# Patient Record
Sex: Male | Born: 1955 | Race: White | Hispanic: No | Marital: Married | Smoking: Never smoker
Health system: Southern US, Community
[De-identification: ages and names within clinical notes are randomized; demographics above are authoritative.]

## PROBLEM LIST (undated history)

## (undated) DIAGNOSIS — I10 Essential (primary) hypertension: Secondary | ICD-10-CM

## (undated) DIAGNOSIS — R569 Unspecified convulsions: Secondary | ICD-10-CM

## (undated) DIAGNOSIS — M1990 Unspecified osteoarthritis, unspecified site: Secondary | ICD-10-CM

## (undated) DIAGNOSIS — Z7289 Other problems related to lifestyle: Secondary | ICD-10-CM

## (undated) DIAGNOSIS — M109 Gout, unspecified: Secondary | ICD-10-CM

## (undated) DIAGNOSIS — R55 Syncope and collapse: Secondary | ICD-10-CM

## (undated) DIAGNOSIS — Z789 Other specified health status: Secondary | ICD-10-CM

## (undated) DIAGNOSIS — F1090 Alcohol use, unspecified, uncomplicated: Secondary | ICD-10-CM

## (undated) DIAGNOSIS — E785 Hyperlipidemia, unspecified: Secondary | ICD-10-CM

## (undated) DIAGNOSIS — J45909 Unspecified asthma, uncomplicated: Secondary | ICD-10-CM

## (undated) DIAGNOSIS — J349 Unspecified disorder of nose and nasal sinuses: Secondary | ICD-10-CM

## (undated) DIAGNOSIS — C73 Malignant neoplasm of thyroid gland: Secondary | ICD-10-CM

## (undated) HISTORY — PX: NASAL POLYP SURGERY: SHX186

## (undated) HISTORY — PX: POLYPECTOMY: SHX149

## (undated) HISTORY — DX: Unspecified disorder of nose and nasal sinuses: J34.9

## (undated) HISTORY — PX: THYROIDECTOMY: SHX17

## (undated) HISTORY — PX: COLONOSCOPY: SHX174

## (undated) HISTORY — DX: Gout, unspecified: M10.9

## (undated) HISTORY — PX: NASAL SEPTUM SURGERY: SHX37

## (undated) HISTORY — DX: Unspecified convulsions: R56.9

## (undated) HISTORY — DX: Unspecified asthma, uncomplicated: J45.909

---

## 2014-03-07 ENCOUNTER — Ambulatory Visit (INDEPENDENT_AMBULATORY_CARE_PROVIDER_SITE_OTHER): Payer: 59

## 2014-03-07 VITALS — BP 117/78 | HR 86 | Resp 18

## 2014-03-07 DIAGNOSIS — L600 Ingrowing nail: Secondary | ICD-10-CM

## 2014-03-07 DIAGNOSIS — M79609 Pain in unspecified limb: Secondary | ICD-10-CM

## 2014-03-07 DIAGNOSIS — L03039 Cellulitis of unspecified toe: Secondary | ICD-10-CM

## 2014-03-07 MED ORDER — CEPHALEXIN 500 MG PO CAPS: 500.0000 mg | ORAL_CAPSULE | Freq: Three times a day (TID) | ORAL | Status: DC

## 2014-03-07 NOTE — Progress Notes (Signed)
   Subjective:    Patient ID: Devon Gordon, male    DOB: Jul 30, 1956, 58 y.o.   MRN: [id number]  HPI my right big toenail has been going on for about 3 weeks now and sore and tender and some draining and throbs and hurts with pressure and have did some soaking to it    Review of Systems  All other systems reviewed and are negative.      Objective:   Physical Exam 58 year old white male well-developed well-nourished oriented x3 vital signs stable presents at this time with ingrowing right great toenail medial lateral borders some 40 years ago had the nail on the left hallux work on. At this time there is some edema and erythema medial lateral nail folds of the right great toe and there is Pitocin incurvation of the nail. No history of injury or trauma noted indicates but year ago he is to with Lamisil for fungus nails the nail improvement since that time is having a tendency for ingrowing nails borders. The artery objective findings as follows vascular status is intact pedal pulses palpable DP pulse two over four bilateral PT +2/4 bilateral capillary refill time 3 seconds all digits skin temperature warm turgor normal no edema rubor pallor or varicosities noted neurologically epicritic and proprioceptive sensations intact and symmetric bilateral is normal plantar response DTRs not listed neurologically skin color pigment normal hair growth present diminished distally nails criptotic incurvated hallux nail right with some edema and erythema and drainage tissue medial lateral nail folds being noted left hallux shows evidence of partial nail excision particular lateral border. Orthopedic biomechanical exam otherwise unremarkable noncontributory rectus foot type noted no other abnormalities normal findings noted       Assessment & Plan:  Assessment this time is ingrowing right hallux nail medial lateral borders with associated paronychia the nail folds plan at this time for recommendation is for nail  excision and phenol matricectomy medial lateral borders right great toe. Local anesthetic blocks Mr. to the right hallux total of 3 cc 50-50 mixture of 2% Xylocaine plain and 0.5% Marcaine plain Betadine prep performed the hallux nail borders medial lateral or excised phenol matricectomy followed by alcohol wash Silvadene cream and gauze dressing being applied to the right great toe. Patient how the procedure was given instructions for daily soaking prescription for cephalexin is dispensed recommended Tylenol as needed for pain recheck in 2-3 weeks for nail check and followup maintain accommodative shoes in the interim.  Harriet Masson DPM

## 2014-03-07 NOTE — Patient Instructions (Signed)
Betadine Soak Instructions  Purchase an 8 oz. bottle of BETADINE solution (Povidone)  THE DAY AFTER THE PROCEDURE  Place 1 tablespoon of betadine solution in a quart of warm tap water.  Submerge your foot or feet with outer bandage intact for the initial soak; this will allow the bandage to become moist and wet for easy lift off.  Once you remove your bandage, continue to soak in the solution for 20 minutes.  This soak should be done twice a day.  Next, remove your foot or feet from solution, blot dry the affected area and cover.  You may use a band aid large enough to cover the area or use gauze and tape.  Apply other medications to the area as directed by the doctor such as cortisporin otic solution (ear drops) or neosporin.  IF YOUR SKIN BECOMES IRRITATED WHILE USING THESE INSTRUCTIONS, IT IS OKAY TO SWITCH TO EPSOM SALTS AND WATER OR WHITE VINEGAR AND WATER.  Recommendations for pain: Take 2 Tylenol 3 times a day as needed for pain

## 2014-03-21 ENCOUNTER — Ambulatory Visit (INDEPENDENT_AMBULATORY_CARE_PROVIDER_SITE_OTHER): Payer: 59

## 2014-03-21 VITALS — BP 144/87 | HR 62 | Resp 18

## 2014-03-21 DIAGNOSIS — L600 Ingrowing nail: Secondary | ICD-10-CM

## 2014-03-21 DIAGNOSIS — Z09 Encounter for follow-up examination after completed treatment for conditions other than malignant neoplasm: Secondary | ICD-10-CM

## 2014-03-21 NOTE — Progress Notes (Signed)
   Subjective:    Patient ID: Adrick Kestler, male    DOB: May 16, 1956, 58 y.o.   MRN: [id number]  HPI my right big toenail is better and it is starting to scab over    Review of Systems no new systemic changes or findings are noted     Objective:   Physical Exam 58 year old white male consistent for postop visit following AP nail procedure right great toe medial lateral borders. Slight eschar tissue identified mild erythema is noted no ascending psoas with additional pain or discomfort patient is intact running. Neurovascular status is intact no signs of infection. Patient been soaking is instructed in maintaining Neosporin and Band-Aid dressings.       Assessment & Plan:  Assessment good postop progress keep Neosporin Band-Aid on her daily or drainage night discontinue any soaking the treatment or Band-Aids if it stops draining with the next 2-3 weeks if not heal within the next 3 or 4 weeks followup if needed  Harriet Masson DP

## 2014-03-21 NOTE — Patient Instructions (Signed)

## 2014-11-15 DIAGNOSIS — C73 Malignant neoplasm of thyroid gland: Secondary | ICD-10-CM

## 2014-11-15 HISTORY — DX: Malignant neoplasm of thyroid gland: C73

## 2015-08-01 DIAGNOSIS — J329 Chronic sinusitis, unspecified: Secondary | ICD-10-CM | POA: Insufficient documentation

## 2015-08-01 DIAGNOSIS — J4550 Severe persistent asthma, uncomplicated: Secondary | ICD-10-CM | POA: Insufficient documentation

## 2015-08-09 ENCOUNTER — Other Ambulatory Visit: Payer: Self-pay

## 2015-08-09 MED ORDER — OMALIZUMAB 150 MG ~~LOC~~ SOLR
300.0000 mg | SUBCUTANEOUS | Status: DC
  Administered 2015-09-18 – 2022-06-04 (×74): 300 mg via SUBCUTANEOUS

## 2015-09-18 ENCOUNTER — Ambulatory Visit (INDEPENDENT_AMBULATORY_CARE_PROVIDER_SITE_OTHER): Payer: 59

## 2015-09-18 DIAGNOSIS — J4540 Moderate persistent asthma, uncomplicated: Secondary | ICD-10-CM | POA: Diagnosis not present

## 2015-09-18 DIAGNOSIS — J4550 Severe persistent asthma, uncomplicated: Secondary | ICD-10-CM

## 2015-09-26 ENCOUNTER — Other Ambulatory Visit: Payer: Self-pay

## 2015-09-26 MED ORDER — MONTELUKAST SODIUM 10 MG PO TABS: 10.0000 mg | ORAL_TABLET | Freq: Every day | ORAL | Status: DC

## 2015-10-16 ENCOUNTER — Ambulatory Visit (INDEPENDENT_AMBULATORY_CARE_PROVIDER_SITE_OTHER): Payer: 59

## 2015-10-16 DIAGNOSIS — J4550 Severe persistent asthma, uncomplicated: Secondary | ICD-10-CM

## 2015-10-16 DIAGNOSIS — J4540 Moderate persistent asthma, uncomplicated: Secondary | ICD-10-CM

## 2015-11-13 ENCOUNTER — Ambulatory Visit (INDEPENDENT_AMBULATORY_CARE_PROVIDER_SITE_OTHER): Payer: 59

## 2015-11-13 DIAGNOSIS — J4550 Severe persistent asthma, uncomplicated: Secondary | ICD-10-CM | POA: Diagnosis not present

## 2015-11-15 ENCOUNTER — Ambulatory Visit: Payer: 59 | Admitting: *Deleted

## 2015-12-01 DIAGNOSIS — R569 Unspecified convulsions: Secondary | ICD-10-CM

## 2015-12-01 HISTORY — DX: Unspecified convulsions: R56.9

## 2015-12-11 ENCOUNTER — Ambulatory Visit (INDEPENDENT_AMBULATORY_CARE_PROVIDER_SITE_OTHER): Payer: 59 | Admitting: *Deleted

## 2015-12-11 DIAGNOSIS — J4540 Moderate persistent asthma, uncomplicated: Secondary | ICD-10-CM

## 2015-12-12 ENCOUNTER — Other Ambulatory Visit: Payer: Self-pay

## 2015-12-12 MED ORDER — FLUTICASONE PROPIONATE 50 MCG/ACT NA SUSP: 2.0000 | Freq: Every day | NASAL | Status: DC

## 2015-12-25 ENCOUNTER — Other Ambulatory Visit: Payer: Self-pay | Admitting: *Deleted

## 2015-12-25 MED ORDER — OMALIZUMAB 150 MG ~~LOC~~ SOLR: 300.0000 mg | SUBCUTANEOUS | Status: DC

## 2016-01-15 ENCOUNTER — Ambulatory Visit (INDEPENDENT_AMBULATORY_CARE_PROVIDER_SITE_OTHER): Payer: 59 | Admitting: *Deleted

## 2016-01-15 DIAGNOSIS — J4540 Moderate persistent asthma, uncomplicated: Secondary | ICD-10-CM | POA: Diagnosis not present

## 2016-02-13 ENCOUNTER — Emergency Department (HOSPITAL_COMMUNITY): Payer: No Typology Code available for payment source

## 2016-02-13 ENCOUNTER — Observation Stay (HOSPITAL_COMMUNITY): Payer: No Typology Code available for payment source

## 2016-02-13 ENCOUNTER — Encounter (HOSPITAL_COMMUNITY): Payer: Self-pay | Admitting: Emergency Medicine

## 2016-02-13 ENCOUNTER — Observation Stay (HOSPITAL_COMMUNITY)
Admission: EM | Admit: 2016-02-13 | Discharge: 2016-02-15 | Disposition: A | Discharge status: Home / Self Care | Payer: No Typology Code available for payment source | Attending: Oncology | Admitting: Oncology

## 2016-02-13 DIAGNOSIS — R569 Unspecified convulsions: Secondary | ICD-10-CM | POA: Insufficient documentation

## 2016-02-13 DIAGNOSIS — I951 Orthostatic hypotension: Secondary | ICD-10-CM | POA: Diagnosis not present

## 2016-02-13 DIAGNOSIS — M5136 Other intervertebral disc degeneration, lumbar region: Secondary | ICD-10-CM | POA: Insufficient documentation

## 2016-02-13 DIAGNOSIS — E785 Hyperlipidemia, unspecified: Secondary | ICD-10-CM | POA: Insufficient documentation

## 2016-02-13 DIAGNOSIS — G4089 Other seizures: Principal | ICD-10-CM | POA: Insufficient documentation

## 2016-02-13 DIAGNOSIS — Z789 Other specified health status: Secondary | ICD-10-CM | POA: Diagnosis present

## 2016-02-13 DIAGNOSIS — S32019A Unspecified fracture of first lumbar vertebra, initial encounter for closed fracture: Secondary | ICD-10-CM | POA: Diagnosis not present

## 2016-02-13 DIAGNOSIS — I10 Essential (primary) hypertension: Secondary | ICD-10-CM | POA: Diagnosis not present

## 2016-02-13 DIAGNOSIS — M109 Gout, unspecified: Secondary | ICD-10-CM | POA: Diagnosis not present

## 2016-02-13 DIAGNOSIS — J45909 Unspecified asthma, uncomplicated: Secondary | ICD-10-CM | POA: Diagnosis not present

## 2016-02-13 DIAGNOSIS — I444 Left anterior fascicular block: Secondary | ICD-10-CM | POA: Insufficient documentation

## 2016-02-13 DIAGNOSIS — E872 Acidosis: Secondary | ICD-10-CM | POA: Insufficient documentation

## 2016-02-13 DIAGNOSIS — Z79899 Other long term (current) drug therapy: Secondary | ICD-10-CM | POA: Insufficient documentation

## 2016-02-13 DIAGNOSIS — R55 Syncope and collapse: Secondary | ICD-10-CM | POA: Diagnosis present

## 2016-02-13 DIAGNOSIS — Z7289 Other problems related to lifestyle: Secondary | ICD-10-CM | POA: Diagnosis present

## 2016-02-13 DIAGNOSIS — M51369 Other intervertebral disc degeneration, lumbar region without mention of lumbar back pain or lower extremity pain: Secondary | ICD-10-CM | POA: Diagnosis present

## 2016-02-13 DIAGNOSIS — M1990 Unspecified osteoarthritis, unspecified site: Secondary | ICD-10-CM | POA: Diagnosis present

## 2016-02-13 DIAGNOSIS — I4510 Unspecified right bundle-branch block: Secondary | ICD-10-CM | POA: Diagnosis not present

## 2016-02-13 DIAGNOSIS — S32010A Wedge compression fracture of first lumbar vertebra, initial encounter for closed fracture: Secondary | ICD-10-CM | POA: Diagnosis present

## 2016-02-13 DIAGNOSIS — E890 Postprocedural hypothyroidism: Secondary | ICD-10-CM | POA: Diagnosis present

## 2016-02-13 DIAGNOSIS — Z85850 Personal history of malignant neoplasm of thyroid: Secondary | ICD-10-CM

## 2016-02-13 DIAGNOSIS — E8729 Other acidosis: Secondary | ICD-10-CM | POA: Diagnosis present

## 2016-02-13 DIAGNOSIS — R4020 Unspecified coma: Secondary | ICD-10-CM

## 2016-02-13 DIAGNOSIS — S32000A Wedge compression fracture of unspecified lumbar vertebra, initial encounter for closed fracture: Secondary | ICD-10-CM

## 2016-02-13 DIAGNOSIS — F1010 Alcohol abuse, uncomplicated: Secondary | ICD-10-CM | POA: Diagnosis present

## 2016-02-13 DIAGNOSIS — F1090 Alcohol use, unspecified, uncomplicated: Secondary | ICD-10-CM | POA: Diagnosis present

## 2016-02-13 HISTORY — DX: Other specified health status: Z78.9

## 2016-02-13 HISTORY — DX: Unspecified osteoarthritis, unspecified site: M19.90

## 2016-02-13 HISTORY — DX: Alcohol use, unspecified, uncomplicated: F10.90

## 2016-02-13 HISTORY — DX: Other problems related to lifestyle: Z72.89

## 2016-02-13 HISTORY — DX: Essential (primary) hypertension: I10

## 2016-02-13 HISTORY — DX: Malignant neoplasm of thyroid gland: C73

## 2016-02-13 HISTORY — DX: Hyperlipidemia, unspecified: E78.5

## 2016-02-13 HISTORY — DX: Syncope and collapse: R55

## 2016-02-13 LAB — CBC WITH DIFFERENTIAL/PLATELET
BASOS PCT: 1 %
Basophils Absolute: 0 10*3/uL (ref 0.0–0.1)
EOS ABS: 0.1 10*3/uL (ref 0.0–0.7)
EOS PCT: 1 %
HCT: 42.4 % (ref 39.0–52.0)
HEMOGLOBIN: 15 g/dL (ref 13.0–17.0)
Lymphocytes Relative: 26 %
Lymphs Abs: 1.5 10*3/uL (ref 0.7–4.0)
MCH: 31.4 pg (ref 26.0–34.0)
MCHC: 35.4 g/dL (ref 30.0–36.0)
MCV: 88.7 fL (ref 78.0–100.0)
MONOS PCT: 4 %
Monocytes Absolute: 0.2 10*3/uL (ref 0.1–1.0)
NEUTROS PCT: 68 %
Neutro Abs: 3.9 10*3/uL (ref 1.7–7.7)
PLATELETS: 162 10*3/uL (ref 150–400)
RBC: 4.78 MIL/uL (ref 4.22–5.81)
RDW: 12.6 % (ref 11.5–15.5)
WBC: 5.8 10*3/uL (ref 4.0–10.5)

## 2016-02-13 LAB — COMPREHENSIVE METABOLIC PANEL
ALK PHOS: 79 U/L (ref 38–126)
ALT: 27 U/L (ref 17–63)
AST: 26 U/L (ref 15–41)
Albumin: 4.4 g/dL (ref 3.5–5.0)
Anion gap: 14 (ref 5–15)
BILIRUBIN TOTAL: 0.5 mg/dL (ref 0.3–1.2)
BUN: 11 mg/dL (ref 6–20)
CALCIUM: 9.5 mg/dL (ref 8.9–10.3)
CO2: 21 mmol/L — ABNORMAL LOW (ref 22–32)
CREATININE: 1.08 mg/dL (ref 0.61–1.24)
Chloride: 106 mmol/L (ref 101–111)
GFR calc Af Amer: >60 mL/min (ref >60)
GLUCOSE: 132 mg/dL — AB (ref 65–99)
Potassium: 4 mmol/L (ref 3.5–5.1)
Sodium: 141 mmol/L (ref 135–145)
TOTAL PROTEIN: 7.2 g/dL (ref 6.5–8.1)

## 2016-02-13 LAB — RAPID URINE DRUG SCREEN, HOSP PERFORMED
Amphetamines: NOT DETECTED
BARBITURATES: NOT DETECTED
Benzodiazepines: NOT DETECTED
Cocaine: NOT DETECTED
Opiates: NOT DETECTED
TETRAHYDROCANNABINOL: NOT DETECTED

## 2016-02-13 LAB — I-STAT TROPONIN, ED: TROPONIN I, POC: 0 ng/mL (ref 0.00–0.08)

## 2016-02-13 LAB — URINALYSIS, ROUTINE W REFLEX MICROSCOPIC
BILIRUBIN URINE: NEGATIVE
GLUCOSE, UA: NEGATIVE mg/dL
HGB URINE DIPSTICK: NEGATIVE
KETONES UR: NEGATIVE mg/dL
Leukocytes, UA: NEGATIVE
NITRITE: NEGATIVE
PH: 5 (ref 5.0–8.0)
Protein, ur: NEGATIVE mg/dL
Specific Gravity, Urine: 1.016 (ref 1.005–1.030)

## 2016-02-13 LAB — PHOSPHORUS: Phosphorus: 2.5 mg/dL (ref 2.5–4.6)

## 2016-02-13 LAB — ETHANOL: Alcohol, Ethyl (B): <5 mg/dL (ref <5)

## 2016-02-13 LAB — MAGNESIUM: Magnesium: 2.2 mg/dL (ref 1.7–2.4)

## 2016-02-13 MED ORDER — SODIUM CHLORIDE 0.9% FLUSH
3.0000 mL | Freq: Two times a day (BID) | INTRAVENOUS | Status: DC
  Administered 2016-02-13 – 2016-02-15 (×4): 3 mL via INTRAVENOUS

## 2016-02-13 MED ORDER — ACETAMINOPHEN 325 MG PO TABS
650.0000 mg | ORAL_TABLET | Freq: Once | ORAL | Status: DC
Start: 2016-02-13 — End: 2016-02-13
  Filled 2016-02-13: qty 2

## 2016-02-13 MED ORDER — ACETAMINOPHEN 325 MG PO TABS
650.0000 mg | ORAL_TABLET | Freq: Four times a day (QID) | ORAL | Status: DC | PRN
  Administered 2016-02-13 – 2016-02-14 (×2): 650 mg via ORAL
  Filled 2016-02-13 (×2): qty 2

## 2016-02-13 MED ORDER — ONDANSETRON HCL 4 MG PO TABS: 4.0000 mg | ORAL_TABLET | Freq: Four times a day (QID) | ORAL | Status: DC | PRN

## 2016-02-13 MED ORDER — DIAZEPAM 5 MG PO TABS: 5.0000 mg | ORAL_TABLET | Freq: Once | ORAL | Status: DC

## 2016-02-13 MED ORDER — LORAZEPAM 1 MG PO TABS: 1.0000 mg | ORAL_TABLET | Freq: Four times a day (QID) | ORAL | Status: DC | PRN

## 2016-02-13 MED ORDER — THIAMINE HCL 100 MG/ML IJ SOLN: 100.0000 mg | Freq: Every day | INTRAMUSCULAR | Status: DC

## 2016-02-13 MED ORDER — ONDANSETRON HCL 4 MG/2ML IJ SOLN: 4.0000 mg | Freq: Four times a day (QID) | INTRAMUSCULAR | Status: DC | PRN

## 2016-02-13 MED ORDER — FOLIC ACID 1 MG PO TABS
1.0000 mg | ORAL_TABLET | Freq: Every day | ORAL | Status: DC
  Administered 2016-02-13 – 2016-02-15 (×3): 1 mg via ORAL
  Filled 2016-02-13 (×3): qty 1

## 2016-02-13 MED ORDER — ALLOPURINOL 100 MG PO TABS
100.0000 mg | ORAL_TABLET | Freq: Every day | ORAL | Status: DC
  Administered 2016-02-13 – 2016-02-15 (×3): 100 mg via ORAL
  Filled 2016-02-13 (×3): qty 1

## 2016-02-13 MED ORDER — LEVOTHYROXINE SODIUM 137 MCG PO TABS
137.0000 ug | ORAL_TABLET | Freq: Every day | ORAL | Status: DC
  Administered 2016-02-14 – 2016-02-15 (×2): 137 ug via ORAL
  Filled 2016-02-13 (×2): qty 1

## 2016-02-13 MED ORDER — ATORVASTATIN CALCIUM 40 MG PO TABS
40.0000 mg | ORAL_TABLET | Freq: Every day | ORAL | Status: DC
  Administered 2016-02-13 – 2016-02-14 (×2): 40 mg via ORAL
  Filled 2016-02-13 (×2): qty 1

## 2016-02-13 MED ORDER — DEXTROSE-NACL 5-0.9 % IV SOLN
INTRAVENOUS | Status: DC
  Administered 2016-02-13: 21:00:00 via INTRAVENOUS

## 2016-02-13 MED ORDER — ADULT MULTIVITAMIN W/MINERALS CH
1.0000 | ORAL_TABLET | Freq: Every day | ORAL | Status: DC
  Administered 2016-02-13 – 2016-02-15 (×3): 1 via ORAL
  Filled 2016-02-13 (×3): qty 1

## 2016-02-13 MED ORDER — DEXTROSE-NACL 5-0.9 % IV SOLN: INTRAVENOUS | Status: DC

## 2016-02-13 MED ORDER — ENOXAPARIN SODIUM 40 MG/0.4ML ~~LOC~~ SOLN
40.0000 mg | SUBCUTANEOUS | Status: DC
  Administered 2016-02-13 – 2016-02-14 (×2): 40 mg via SUBCUTANEOUS
  Filled 2016-02-13 (×2): qty 0.4

## 2016-02-13 MED ORDER — VITAMIN B-1 100 MG PO TABS
100.0000 mg | ORAL_TABLET | Freq: Every day | ORAL | Status: DC
  Administered 2016-02-13 – 2016-02-15 (×3): 100 mg via ORAL
  Filled 2016-02-13 (×3): qty 1

## 2016-02-13 MED ORDER — SENNOSIDES-DOCUSATE SODIUM 8.6-50 MG PO TABS: 1.0000 | ORAL_TABLET | Freq: Every evening | ORAL | Status: DC | PRN

## 2016-02-13 MED ORDER — MONTELUKAST SODIUM 10 MG PO TABS
10.0000 mg | ORAL_TABLET | Freq: Every day | ORAL | Status: DC
  Administered 2016-02-13 – 2016-02-14 (×2): 10 mg via ORAL
  Filled 2016-02-13 (×2): qty 1

## 2016-02-13 MED ORDER — ACETAMINOPHEN 650 MG RE SUPP: 650.0000 mg | Freq: Four times a day (QID) | RECTAL | Status: DC | PRN

## 2016-02-13 MED ORDER — LORAZEPAM 2 MG/ML IJ SOLN: 1.0000 mg | Freq: Four times a day (QID) | INTRAMUSCULAR | Status: DC | PRN

## 2016-02-13 NOTE — Progress Notes (Signed)
EEG Completed; Results Pending  
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
## 2016-02-13 NOTE — ED Notes (Signed)
Admitting MD at bedside.
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
## 2016-02-13 NOTE — Progress Notes (Signed)
At 1650 pt oriented to room.  Call bell at reach.  Spouse at bed side.  Instructed to call for assistance.  Verbalized understanding. Karie Kirks, Therapist, sports.
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
## 2016-02-13 NOTE — Progress Notes (Signed)
Notified Dr. Hermenia Bers of pt's arrival to floor.  No new orders given.  Karie Kirks, Therapist, sports.

## 2016-02-14 ENCOUNTER — Observation Stay (HOSPITAL_COMMUNITY): Payer: No Typology Code available for payment source

## 2016-02-14 DIAGNOSIS — M109 Gout, unspecified: Secondary | ICD-10-CM

## 2016-02-14 DIAGNOSIS — I10 Essential (primary) hypertension: Secondary | ICD-10-CM

## 2016-02-14 DIAGNOSIS — Z9089 Acquired absence of other organs: Secondary | ICD-10-CM

## 2016-02-14 DIAGNOSIS — S32010A Wedge compression fracture of first lumbar vertebra, initial encounter for closed fracture: Secondary | ICD-10-CM

## 2016-02-14 DIAGNOSIS — X58XXXA Exposure to other specified factors, initial encounter: Secondary | ICD-10-CM

## 2016-02-14 DIAGNOSIS — Z85850 Personal history of malignant neoplasm of thyroid: Secondary | ICD-10-CM

## 2016-02-14 DIAGNOSIS — R569 Unspecified convulsions: Secondary | ICD-10-CM | POA: Diagnosis not present

## 2016-02-14 DIAGNOSIS — E785 Hyperlipidemia, unspecified: Secondary | ICD-10-CM

## 2016-02-14 DIAGNOSIS — G40A09 Absence epileptic syndrome, not intractable, without status epilepticus: Secondary | ICD-10-CM | POA: Diagnosis not present

## 2016-02-14 DIAGNOSIS — G4089 Other seizures: Secondary | ICD-10-CM | POA: Diagnosis not present

## 2016-02-14 LAB — BASIC METABOLIC PANEL
Anion gap: 9 (ref 5–15)
BUN: 12 mg/dL (ref 6–20)
CALCIUM: 8.3 mg/dL — AB (ref 8.9–10.3)
CO2: 24 mmol/L (ref 22–32)
Chloride: 109 mmol/L (ref 101–111)
Creatinine, Ser: 1.04 mg/dL (ref 0.61–1.24)
GFR calc Af Amer: >60 mL/min (ref >60)
GLUCOSE: 116 mg/dL — AB (ref 65–99)
POTASSIUM: 3.9 mmol/L (ref 3.5–5.1)
SODIUM: 142 mmol/L (ref 135–145)

## 2016-02-14 LAB — HIV ANTIBODY (ROUTINE TESTING W REFLEX): HIV SCREEN 4TH GENERATION: NONREACTIVE

## 2016-02-14 LAB — TROPONIN I

## 2016-02-14 MED ORDER — ACETAMINOPHEN 500 MG PO TABS
1000.0000 mg | ORAL_TABLET | Freq: Three times a day (TID) | ORAL | Status: DC
Start: 2016-02-14 — End: 2016-02-15
  Administered 2016-02-14 – 2016-02-15 (×4): 1000 mg via ORAL
  Filled 2016-02-14 (×4): qty 2

## 2016-02-14 MED ORDER — GADOBENATE DIMEGLUMINE 529 MG/ML IV SOLN
19.0000 mL | Freq: Once | INTRAVENOUS | Status: AC | PRN
  Administered 2016-02-14: 19 mL via INTRAVENOUS

## 2016-02-14 MED ORDER — DIVALPROEX SODIUM 250 MG PO DR TAB
500.0000 mg | DELAYED_RELEASE_TABLET | Freq: Two times a day (BID) | ORAL | Status: DC
  Administered 2016-02-14 – 2016-02-15 (×2): 500 mg via ORAL
  Filled 2016-02-14 (×2): qty 2

## 2016-02-14 MED ORDER — VALPROATE SODIUM 500 MG/5ML IV SOLN
1000.0000 mg | Freq: Once | INTRAVENOUS | Status: AC
  Administered 2016-02-14: 1000 mg via INTRAVENOUS
  Filled 2016-02-14: qty 10
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
## 2016-02-15 DIAGNOSIS — G4089 Other seizures: Secondary | ICD-10-CM | POA: Diagnosis not present

## 2016-02-15 DIAGNOSIS — R569 Unspecified convulsions: Secondary | ICD-10-CM | POA: Diagnosis not present

## 2016-02-15 LAB — HEPATITIS C ANTIBODY (REFLEX): HCV AB: 0.1 {s_co_ratio} (ref 0.0–0.9)

## 2016-02-15 LAB — VALPROIC ACID LEVEL: Valproic Acid Lvl: 42 ug/mL — ABNORMAL LOW (ref 50.0–100.0)

## 2016-02-15 LAB — HCV COMMENT:

## 2016-02-15 MED ORDER — LIDOCAINE 5 % EX PTCH: 1.0000 | MEDICATED_PATCH | CUTANEOUS | Status: DC

## 2016-02-15 MED ORDER — ACETAMINOPHEN 500 MG PO TABS
1000.0000 mg | ORAL_TABLET | Freq: Three times a day (TID) | ORAL | Status: DC | PRN
Start: 2016-02-15 — End: 2016-06-22

## 2016-02-15 MED ORDER — DIVALPROEX SODIUM 250 MG PO DR TAB: 750.0000 mg | DELAYED_RELEASE_TABLET | Freq: Two times a day (BID) | ORAL | Status: AC

## 2016-02-15 MED ORDER — LIDOCAINE 5 % EX PTCH
1.0000 | MEDICATED_PATCH | CUTANEOUS | Status: DC
  Administered 2016-02-15: 1 via TRANSDERMAL
  Filled 2016-02-15: qty 1

## 2016-02-15 MED ORDER — DIVALPROEX SODIUM 250 MG PO DR TAB: 750.0000 mg | DELAYED_RELEASE_TABLET | Freq: Two times a day (BID) | ORAL | Status: DC
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
## 2016-02-15 NOTE — Progress Notes (Signed)
At 1639 all d/c instructions explained and given to pt.  Verbalized understanding.   D/c off floor via w/c to awaiting transport.  Karie Kirks, Therapist, sports.

## 2016-02-15 NOTE — Discharge Instructions (Addendum)
Continue to take your seizure medications: Depakote 1 tablet twice a day.  Follow up with Dr. Delice Lesch, neurologist. No driving or operating heavy machinery until cleared by outpatient neurology. Repeat EEG in 2 weeks with recheck of Depakote level, liver function tests, and platelets.  Do not take your blood pressure medication until cleared by your primary care physician.   Seizure, Adult A seizure means there is unusual activity in the brain. A seizure can cause changes in attention or behavior.  HOME CARE   If you are given medicines, take them exactly as told by your doctor.  Keep all doctor visits as told.  Do not swim or drive until your doctor says it is okay.  Teach others what to do if you have a seizure. They should:  Lay you on the ground.  Put a cushion under your head.  Loosen any tight clothing around your neck.  Turn you on your side.  Stay with you until you get better. GET HELP RIGHT AWAY IF:   The seizure lasts longer than 2 to 5 minutes.  The seizure is very bad.  The person does not wake up after the seizure.  The person's attention or behavior changes. Drive the person to the emergency room or call your local emergency services (911 in [place] MAKE SURE YOU:   Understand these instructions.  Will watch your condition.  Will get help right away if you are not doing well or get worse.   This information is not intended to replace advice given to you by your health care provider. Make sure you discuss any questions you have with your health care provider.   Document Released: 05/04/2008 Document Revised: 02/08/2012 Document Reviewed: 06/28/2013 Elsevier Interactive Patient Education [employer].

## 2016-02-19 ENCOUNTER — Ambulatory Visit (INDEPENDENT_AMBULATORY_CARE_PROVIDER_SITE_OTHER): Payer: 59 | Admitting: *Deleted

## 2016-02-19 DIAGNOSIS — J4540 Moderate persistent asthma, uncomplicated: Secondary | ICD-10-CM

## 2016-03-19 ENCOUNTER — Ambulatory Visit (INDEPENDENT_AMBULATORY_CARE_PROVIDER_SITE_OTHER): Payer: 59 | Admitting: *Deleted

## 2016-03-19 DIAGNOSIS — J4540 Moderate persistent asthma, uncomplicated: Secondary | ICD-10-CM | POA: Diagnosis not present

## 2016-04-23 ENCOUNTER — Ambulatory Visit (INDEPENDENT_AMBULATORY_CARE_PROVIDER_SITE_OTHER): Payer: 59 | Admitting: *Deleted

## 2016-04-23 DIAGNOSIS — J4540 Moderate persistent asthma, uncomplicated: Secondary | ICD-10-CM

## 2016-05-21 ENCOUNTER — Ambulatory Visit (INDEPENDENT_AMBULATORY_CARE_PROVIDER_SITE_OTHER): Payer: 59 | Admitting: *Deleted

## 2016-05-21 DIAGNOSIS — J4540 Moderate persistent asthma, uncomplicated: Secondary | ICD-10-CM | POA: Diagnosis not present
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
## 2016-06-18 ENCOUNTER — Ambulatory Visit (INDEPENDENT_AMBULATORY_CARE_PROVIDER_SITE_OTHER): Payer: 59 | Admitting: *Deleted

## 2016-06-18 DIAGNOSIS — J4540 Moderate persistent asthma, uncomplicated: Secondary | ICD-10-CM | POA: Diagnosis not present
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
## 2016-06-22 ENCOUNTER — Ambulatory Visit (INDEPENDENT_AMBULATORY_CARE_PROVIDER_SITE_OTHER): Payer: 59 | Admitting: Allergy and Immunology

## 2016-06-22 ENCOUNTER — Encounter: Payer: Self-pay | Admitting: Allergy and Immunology

## 2016-06-22 VITALS — BP 120/60 | HR 64 | Resp 20 | Ht 66.8 in | Wt 200.4 lb

## 2016-06-22 DIAGNOSIS — J45909 Unspecified asthma, uncomplicated: Principal | ICD-10-CM

## 2016-06-22 DIAGNOSIS — J339 Nasal polyp, unspecified: Secondary | ICD-10-CM | POA: Diagnosis not present

## 2016-06-22 DIAGNOSIS — J45998 Other asthma: Secondary | ICD-10-CM

## 2016-06-22 DIAGNOSIS — Z886 Allergy status to analgesic agent status: Principal | ICD-10-CM

## 2016-06-22 MED ORDER — MONTELUKAST SODIUM 10 MG PO TABS
10.0000 mg | ORAL_TABLET | Freq: Every day | ORAL | 5 refills | Status: AC
Start: 2016-06-22 — End: ?
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
## 2016-06-22 NOTE — Patient Instructions (Signed)
  1. Continue Xolair and EpiPen  2. Continue Flonase one-2 sprays each nostril once a day  3. Continue montelukast 10 mg one tablet once a day  4. Continue Proventil HFA if needed  5. Obtain fall flu vaccine  6. Return to clinic in 1 year or earlier if problem

## 2016-07-16 ENCOUNTER — Ambulatory Visit (INDEPENDENT_AMBULATORY_CARE_PROVIDER_SITE_OTHER): Payer: 59 | Admitting: *Deleted

## 2016-07-16 DIAGNOSIS — J4540 Moderate persistent asthma, uncomplicated: Secondary | ICD-10-CM | POA: Diagnosis not present

## 2016-08-13 ENCOUNTER — Ambulatory Visit (INDEPENDENT_AMBULATORY_CARE_PROVIDER_SITE_OTHER): Payer: 59 | Admitting: *Deleted

## 2016-08-13 DIAGNOSIS — J4540 Moderate persistent asthma, uncomplicated: Secondary | ICD-10-CM | POA: Diagnosis not present

## 2016-09-16 ENCOUNTER — Ambulatory Visit (INDEPENDENT_AMBULATORY_CARE_PROVIDER_SITE_OTHER): Payer: 59

## 2016-09-16 DIAGNOSIS — J4540 Moderate persistent asthma, uncomplicated: Secondary | ICD-10-CM

## 2016-09-21 ENCOUNTER — Other Ambulatory Visit: Payer: Self-pay

## 2016-09-21 MED ORDER — FLUTICASONE PROPIONATE 50 MCG/ACT NA SUSP
NASAL | 10 refills | Status: AC
Start: 2016-09-21 — End: ?

## 2016-10-14 ENCOUNTER — Ambulatory Visit (INDEPENDENT_AMBULATORY_CARE_PROVIDER_SITE_OTHER): Payer: 59

## 2016-10-14 DIAGNOSIS — J4540 Moderate persistent asthma, uncomplicated: Secondary | ICD-10-CM

## 2016-11-11 ENCOUNTER — Ambulatory Visit (INDEPENDENT_AMBULATORY_CARE_PROVIDER_SITE_OTHER): Payer: 59 | Admitting: *Deleted

## 2016-11-11 DIAGNOSIS — J4540 Moderate persistent asthma, uncomplicated: Secondary | ICD-10-CM | POA: Diagnosis not present

## 2016-12-03 ENCOUNTER — Other Ambulatory Visit: Payer: Self-pay | Admitting: Allergy and Immunology

## 2016-12-09 ENCOUNTER — Ambulatory Visit (INDEPENDENT_AMBULATORY_CARE_PROVIDER_SITE_OTHER): Payer: 59

## 2016-12-09 DIAGNOSIS — J4540 Moderate persistent asthma, uncomplicated: Secondary | ICD-10-CM

## 2017-01-06 ENCOUNTER — Ambulatory Visit (INDEPENDENT_AMBULATORY_CARE_PROVIDER_SITE_OTHER): Payer: 59 | Admitting: *Deleted

## 2017-01-06 DIAGNOSIS — J4540 Moderate persistent asthma, uncomplicated: Secondary | ICD-10-CM

## 2017-02-03 ENCOUNTER — Ambulatory Visit (INDEPENDENT_AMBULATORY_CARE_PROVIDER_SITE_OTHER): Payer: 59 | Admitting: *Deleted

## 2017-02-03 DIAGNOSIS — J4540 Moderate persistent asthma, uncomplicated: Secondary | ICD-10-CM | POA: Diagnosis not present

## 2017-03-03 ENCOUNTER — Ambulatory Visit (INDEPENDENT_AMBULATORY_CARE_PROVIDER_SITE_OTHER): Payer: 59

## 2017-03-03 DIAGNOSIS — J4540 Moderate persistent asthma, uncomplicated: Secondary | ICD-10-CM | POA: Diagnosis not present

## 2017-03-31 ENCOUNTER — Ambulatory Visit (INDEPENDENT_AMBULATORY_CARE_PROVIDER_SITE_OTHER): Payer: 59 | Admitting: *Deleted

## 2017-03-31 DIAGNOSIS — J4540 Moderate persistent asthma, uncomplicated: Secondary | ICD-10-CM | POA: Diagnosis not present

## 2017-04-28 ENCOUNTER — Ambulatory Visit (INDEPENDENT_AMBULATORY_CARE_PROVIDER_SITE_OTHER): Payer: 59 | Admitting: *Deleted

## 2017-04-28 DIAGNOSIS — J4540 Moderate persistent asthma, uncomplicated: Secondary | ICD-10-CM

## 2017-05-26 ENCOUNTER — Ambulatory Visit (INDEPENDENT_AMBULATORY_CARE_PROVIDER_SITE_OTHER): Payer: 59 | Admitting: *Deleted

## 2017-05-26 DIAGNOSIS — J4540 Moderate persistent asthma, uncomplicated: Secondary | ICD-10-CM | POA: Diagnosis not present

## 2017-06-23 ENCOUNTER — Ambulatory Visit (INDEPENDENT_AMBULATORY_CARE_PROVIDER_SITE_OTHER): Payer: 59

## 2017-06-23 DIAGNOSIS — J4540 Moderate persistent asthma, uncomplicated: Secondary | ICD-10-CM | POA: Diagnosis not present

## 2017-07-21 ENCOUNTER — Ambulatory Visit (INDEPENDENT_AMBULATORY_CARE_PROVIDER_SITE_OTHER): Payer: 59 | Admitting: *Deleted

## 2017-07-21 DIAGNOSIS — J4540 Moderate persistent asthma, uncomplicated: Secondary | ICD-10-CM

## 2017-08-18 ENCOUNTER — Ambulatory Visit (INDEPENDENT_AMBULATORY_CARE_PROVIDER_SITE_OTHER): Payer: 59 | Admitting: *Deleted

## 2017-08-18 DIAGNOSIS — J4540 Moderate persistent asthma, uncomplicated: Secondary | ICD-10-CM | POA: Diagnosis not present

## 2017-09-15 ENCOUNTER — Ambulatory Visit (INDEPENDENT_AMBULATORY_CARE_PROVIDER_SITE_OTHER): Payer: 59 | Admitting: *Deleted

## 2017-09-15 DIAGNOSIS — J4540 Moderate persistent asthma, uncomplicated: Secondary | ICD-10-CM

## 2017-10-13 ENCOUNTER — Ambulatory Visit (INDEPENDENT_AMBULATORY_CARE_PROVIDER_SITE_OTHER): Payer: 59

## 2017-10-13 DIAGNOSIS — J4540 Moderate persistent asthma, uncomplicated: Secondary | ICD-10-CM | POA: Diagnosis not present

## 2017-11-02 ENCOUNTER — Other Ambulatory Visit: Payer: Self-pay | Admitting: Allergy and Immunology

## 2017-11-10 ENCOUNTER — Ambulatory Visit (INDEPENDENT_AMBULATORY_CARE_PROVIDER_SITE_OTHER): Payer: 59 | Admitting: *Deleted

## 2017-11-10 DIAGNOSIS — J4540 Moderate persistent asthma, uncomplicated: Secondary | ICD-10-CM

## 2017-12-08 ENCOUNTER — Ambulatory Visit (INDEPENDENT_AMBULATORY_CARE_PROVIDER_SITE_OTHER): Payer: 59

## 2017-12-08 DIAGNOSIS — J4540 Moderate persistent asthma, uncomplicated: Secondary | ICD-10-CM | POA: Diagnosis not present

## 2018-01-05 ENCOUNTER — Ambulatory Visit (INDEPENDENT_AMBULATORY_CARE_PROVIDER_SITE_OTHER): Payer: 59 | Admitting: *Deleted

## 2018-01-05 DIAGNOSIS — J4540 Moderate persistent asthma, uncomplicated: Secondary | ICD-10-CM

## 2018-01-24 ENCOUNTER — Encounter: Payer: Self-pay | Admitting: Allergy and Immunology

## 2018-01-24 ENCOUNTER — Ambulatory Visit (INDEPENDENT_AMBULATORY_CARE_PROVIDER_SITE_OTHER): Payer: 59 | Admitting: Allergy and Immunology

## 2018-01-24 VITALS — BP 148/80 | HR 64 | Resp 16

## 2018-01-24 DIAGNOSIS — Z886 Allergy status to analgesic agent status: Secondary | ICD-10-CM

## 2018-01-24 DIAGNOSIS — J339 Nasal polyp, unspecified: Secondary | ICD-10-CM | POA: Diagnosis not present

## 2018-01-24 DIAGNOSIS — J45909 Unspecified asthma, uncomplicated: Secondary | ICD-10-CM

## 2018-01-24 NOTE — Patient Instructions (Addendum)
  1. Continue Xolair and EpiPen  2. Continue Flonase one-2 sprays each nostril once a day  3. Continue montelukast 10 mg one tablet once a day  4. Continue Proventil HFA if needed  5. PREDNISONE 10mg  tablet one time per day for 10 days. Further treatment?  6. Return to clinic in 1 year or earlier if problem
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
## 2018-01-25 ENCOUNTER — Encounter: Payer: Self-pay | Admitting: Allergy and Immunology

## 2018-02-02 ENCOUNTER — Ambulatory Visit (INDEPENDENT_AMBULATORY_CARE_PROVIDER_SITE_OTHER): Payer: 59 | Admitting: *Deleted

## 2018-02-02 DIAGNOSIS — J4540 Moderate persistent asthma, uncomplicated: Secondary | ICD-10-CM | POA: Diagnosis not present

## 2018-03-02 ENCOUNTER — Ambulatory Visit (INDEPENDENT_AMBULATORY_CARE_PROVIDER_SITE_OTHER): Payer: 59 | Admitting: *Deleted

## 2018-03-02 DIAGNOSIS — J4540 Moderate persistent asthma, uncomplicated: Secondary | ICD-10-CM

## 2018-03-30 ENCOUNTER — Ambulatory Visit: Payer: Self-pay

## 2018-03-31 ENCOUNTER — Ambulatory Visit (INDEPENDENT_AMBULATORY_CARE_PROVIDER_SITE_OTHER): Payer: 59 | Admitting: *Deleted

## 2018-03-31 DIAGNOSIS — J4540 Moderate persistent asthma, uncomplicated: Secondary | ICD-10-CM | POA: Diagnosis not present

## 2018-04-28 ENCOUNTER — Ambulatory Visit (INDEPENDENT_AMBULATORY_CARE_PROVIDER_SITE_OTHER): Payer: 59 | Admitting: *Deleted

## 2018-04-28 DIAGNOSIS — J4540 Moderate persistent asthma, uncomplicated: Secondary | ICD-10-CM | POA: Diagnosis not present

## 2018-05-26 ENCOUNTER — Ambulatory Visit: Payer: Self-pay

## 2018-05-30 ENCOUNTER — Ambulatory Visit (INDEPENDENT_AMBULATORY_CARE_PROVIDER_SITE_OTHER): Payer: 59 | Admitting: *Deleted

## 2018-05-30 DIAGNOSIS — J4540 Moderate persistent asthma, uncomplicated: Secondary | ICD-10-CM | POA: Diagnosis not present

## 2018-06-27 ENCOUNTER — Ambulatory Visit (INDEPENDENT_AMBULATORY_CARE_PROVIDER_SITE_OTHER): Payer: 59 | Admitting: *Deleted

## 2018-06-27 DIAGNOSIS — J4540 Moderate persistent asthma, uncomplicated: Secondary | ICD-10-CM | POA: Diagnosis not present

## 2018-07-25 ENCOUNTER — Ambulatory Visit (INDEPENDENT_AMBULATORY_CARE_PROVIDER_SITE_OTHER): Payer: 59 | Admitting: *Deleted

## 2018-07-25 DIAGNOSIS — J4540 Moderate persistent asthma, uncomplicated: Secondary | ICD-10-CM

## 2018-08-22 ENCOUNTER — Ambulatory Visit (INDEPENDENT_AMBULATORY_CARE_PROVIDER_SITE_OTHER): Payer: 59 | Admitting: *Deleted

## 2018-08-22 DIAGNOSIS — J4540 Moderate persistent asthma, uncomplicated: Secondary | ICD-10-CM | POA: Diagnosis not present

## 2018-09-19 ENCOUNTER — Ambulatory Visit (INDEPENDENT_AMBULATORY_CARE_PROVIDER_SITE_OTHER): Payer: 59 | Admitting: *Deleted

## 2018-09-19 DIAGNOSIS — J4540 Moderate persistent asthma, uncomplicated: Secondary | ICD-10-CM | POA: Diagnosis not present

## 2018-10-02 ENCOUNTER — Other Ambulatory Visit: Payer: Self-pay | Admitting: Allergy and Immunology

## 2018-10-17 ENCOUNTER — Ambulatory Visit (INDEPENDENT_AMBULATORY_CARE_PROVIDER_SITE_OTHER): Payer: 59 | Admitting: *Deleted

## 2018-10-17 DIAGNOSIS — J4540 Moderate persistent asthma, uncomplicated: Secondary | ICD-10-CM | POA: Diagnosis not present

## 2018-11-14 ENCOUNTER — Ambulatory Visit (INDEPENDENT_AMBULATORY_CARE_PROVIDER_SITE_OTHER): Payer: 59 | Admitting: *Deleted

## 2018-11-14 DIAGNOSIS — J4540 Moderate persistent asthma, uncomplicated: Secondary | ICD-10-CM | POA: Diagnosis not present

## 2018-11-28 ENCOUNTER — Encounter: Payer: Self-pay | Admitting: Gastroenterology

## 2018-12-12 ENCOUNTER — Ambulatory Visit (INDEPENDENT_AMBULATORY_CARE_PROVIDER_SITE_OTHER): Payer: 59 | Admitting: *Deleted

## 2018-12-12 DIAGNOSIS — J4540 Moderate persistent asthma, uncomplicated: Secondary | ICD-10-CM | POA: Diagnosis not present

## 2018-12-13 ENCOUNTER — Encounter: Payer: Self-pay | Admitting: Gastroenterology

## 2018-12-13 ENCOUNTER — Ambulatory Visit (AMBULATORY_SURGERY_CENTER): Payer: Self-pay | Admitting: *Deleted

## 2018-12-13 VITALS — Ht 68.0 in | Wt 202.0 lb

## 2018-12-13 DIAGNOSIS — Z86010 Personal history of colonic polyps: Secondary | ICD-10-CM

## 2018-12-13 MED ORDER — NA SULFATE-K SULFATE-MG SULF 17.5-3.13-1.6 GM/177ML PO SOLN: ORAL | 0 refills | Status: DC

## 2018-12-13 NOTE — Progress Notes (Signed)
Patient denies any allergies to eggs or soy. Patient denies any problems with anesthesia/sedation. Patient denies any oxygen use at home. Patient denies taking any diet/weight loss medications or blood thinners. EMMI education offered, pt declined. Suprep coupon printed and given to pt.

## 2018-12-26 ENCOUNTER — Encounter: Payer: Self-pay | Admitting: Gastroenterology

## 2019-01-09 ENCOUNTER — Ambulatory Visit (INDEPENDENT_AMBULATORY_CARE_PROVIDER_SITE_OTHER): Payer: 59 | Admitting: *Deleted

## 2019-01-09 DIAGNOSIS — J4540 Moderate persistent asthma, uncomplicated: Secondary | ICD-10-CM

## 2019-01-18 ENCOUNTER — Encounter: Payer: Self-pay | Admitting: Gastroenterology

## 2019-01-27 ENCOUNTER — Encounter: Payer: Self-pay | Admitting: Gastroenterology

## 2019-01-27 ENCOUNTER — Ambulatory Visit (AMBULATORY_SURGERY_CENTER): Payer: 59 | Admitting: Gastroenterology

## 2019-01-27 VITALS — BP 116/73 | HR 54 | Temp 97.5°F | Resp 9 | Ht 68.0 in | Wt 202.0 lb

## 2019-01-27 DIAGNOSIS — D122 Benign neoplasm of ascending colon: Secondary | ICD-10-CM | POA: Diagnosis not present

## 2019-01-27 DIAGNOSIS — Z86010 Personal history of colonic polyps: Secondary | ICD-10-CM | POA: Diagnosis present

## 2019-01-27 DIAGNOSIS — D128 Benign neoplasm of rectum: Secondary | ICD-10-CM | POA: Diagnosis not present

## 2019-01-27 MED ORDER — SODIUM CHLORIDE 0.9 % IV SOLN: 500.0000 mL | Freq: Once | INTRAVENOUS | Status: DC

## 2019-01-27 NOTE — Patient Instructions (Signed)
YOU HAD AN ENDOSCOPIC PROCEDURE TODAY AT [facility]:   Refer to the procedure report that was given to you for any specific questions about what was found during the examination.  If the procedure report does not answer your questions, please call your gastroenterologist to clarify.  If you requested that your care partner not be given the details of your procedure findings, then the procedure report has been included in a sealed envelope for you to review at your convenience later.  YOU SHOULD EXPECT: Some feelings of bloating in the abdomen. Passage of more gas than usual.  Walking can help get rid of the air that was put into your GI tract during the procedure and reduce the bloating. If you had a lower endoscopy (such as a colonoscopy or flexible sigmoidoscopy) you may notice spotting of blood in your stool or on the toilet paper. If you underwent a bowel prep for your procedure, you may not have a normal bowel movement for a few days.  Please Note:  You might notice some irritation and congestion in your nose or some drainage.  This is from the oxygen used during your procedure.  There is no need for concern and it should clear up in a day or so.  SYMPTOMS TO REPORT IMMEDIATELY:   Following lower endoscopy (colonoscopy or flexible sigmoidoscopy):  Excessive amounts of blood in the stool  Significant tenderness or worsening of abdominal pains  Swelling of the abdomen that is new, acute  Fever of 100F or higher   For urgent or emergent issues, a gastroenterologist can be reached at any hour by calling [phone].   DIET:  We do recommend a small meal at first, but then you may proceed to your regular diet.  Drink plenty of fluids but you should avoid alcoholic beverages for 24 hours. Try to eat a high fiber diet, and drink plenty of water. ACTIVITY:  You should plan to take it easy for the rest of today and you should NOT DRIVE or use heavy machinery until tomorrow  (because of the sedation medicines used during the test).    FOLLOW UP: Our staff will call the number listed on your records the next business day following your procedure to check on you and address any questions or concerns that you may have regarding the information given to you following your procedure. If we do not reach you, we will leave a message.  However, if you are feeling well and you are not experiencing any problems, there is no need to return our call.  We will assume that you have returned to your regular daily activities without incident.  If any biopsies were taken you will be contacted by phone or by letter within the next 1-3 weeks.  Please call us at [phone] if you have not heard about the biopsies in 3 weeks.    SIGNATURES/CONFIDENTIALITY: You and/or your care partner have signed paperwork which will be entered into your electronic medical record.  These signatures attest to the fact that that the information above on your After Visit Summary has been reviewed and is understood.  Full responsibility of the confidentiality of this discharge information lies with you and/or your care-partner.  Read all of the handouts given to you by your recovery nurse.

## 2019-01-27 NOTE — Progress Notes (Signed)
Report to PACU, RN, vss, BBS= Clear.  

## 2019-01-27 NOTE — Progress Notes (Signed)
Pt's states no medical or surgical changes since previsit or office visit. 

## 2019-01-27 NOTE — Op Note (Signed)
[place] Patient Name: Devon Gordon Procedure Date: 01/27/2019 10:26 AM MRN: [id number] Endoscopist: Jackquline Denmark , MD Age: 63 Referring MD:  Date of Birth: 01-11-1956 Gender: Male Account #: [id number] Procedure:                Colonoscopy Indications:              High risk colon cancer surveillance: Personal                            history of colonic polyps Medicines:                Monitored Anesthesia Care Procedure:                Pre-Anesthesia Assessment:                           - Prior to the procedure, a History and Physical                            was performed, and patient medications and                            allergies were reviewed. The patient's tolerance of                            previous anesthesia was also reviewed. The risks                            and benefits of the procedure and the sedation                            options and risks were discussed with the patient.                            All questions were answered, and informed consent                            was obtained. Prior Anticoagulants: The patient has                            taken no previous anticoagulant or antiplatelet                            agents. ASA Grade Assessment: I - A normal, healthy                            patient. After reviewing the risks and benefits,                            the patient was deemed in satisfactory condition to                            undergo the procedure.  After obtaining informed consent, the colonoscope                            was passed under direct vision. Throughout the                            procedure, the patient's blood pressure, pulse, and                            oxygen saturations were monitored continuously. The                            Colonoscope was introduced through the anus and                            advanced to the 2 cm into the ileum. The               colonoscopy was performed without difficulty. The                            patient tolerated the procedure well. The quality                            of the bowel preparation was excellent. The                            terminal ileum, ileocecal valve, appendiceal                            orifice, and rectum were photographed. Scope In: 10:30:33 AM Scope Out: 10:42:05 AM Scope Withdrawal Time: 0 hours 7 minutes 20 seconds  Total Procedure Duration: 0 hours 11 minutes 32 seconds  Findings:                 A 4 mm polyp was found in the ascending colon. The                            polyp was sessile. The polyp was removed with a                            cold snare. Resection and retrieval were complete.                            Estimated blood loss: none.                           A 6 mm polyp was found in the rectum. The polyp was                            sessile. The polyp was removed with a cold snare.                            Resection and retrieval were complete. Estimated  blood loss: none.                           A few small-mouthed diverticula were found in the                            sigmoid colon and descending colon.                           Non-bleeding internal hemorrhoids were found during                            retroflexion. The hemorrhoids were minimal.                           The terminal ileum appeared normal.                           The exam was otherwise without abnormality on                            direct and retroflexion views. Complications:            No immediate complications. Estimated Blood Loss:     Estimated blood loss: none. Impression:               -Colonic polyps status post polypectomy.                           -Mild left colonic diverticulosis.                           -Otherwise normal colonoscopy to TI. Recommendation:           - Patient has a contact number available for                             emergencies. The signs and symptoms of potential                            delayed complications were discussed with the                            patient. Return to normal activities tomorrow.                            Written discharge instructions were provided to the                            patient.                           - Resume previous diet.                           - Continue present medications.                           -  Await pathology results.                           - Repeat colonoscopy for surveillance based on                            pathology results.                           - Return to GI clinic PRN. Jackquline Denmark, MD 01/27/2019 10:48:30 AM This report has been signed electronically.

## 2019-01-30 ENCOUNTER — Telehealth: Payer: Self-pay | Admitting: *Deleted

## 2019-01-30 NOTE — Telephone Encounter (Signed)
  Follow up Call-  Call back number 01/27/2019  Post procedure Call Back phone  # [phone]  Permission to leave phone message Yes  Some recent data might be hidden     Patient questions:  Do you have a fever, pain , or abdominal swelling? No. Pain Score  0 *  Have you tolerated food without any problems? Yes.    Have you been able to return to your normal activities? Yes.    Do you have any questions about your discharge instructions: Diet   No. Medications  No. Follow up visit  No.  Do you have questions or concerns about your Care? No.  Actions: * If pain score is 4 or above: No action needed, pain <4.

## 2019-02-05 ENCOUNTER — Encounter: Payer: Self-pay | Admitting: Gastroenterology

## 2019-02-06 ENCOUNTER — Ambulatory Visit (INDEPENDENT_AMBULATORY_CARE_PROVIDER_SITE_OTHER): Payer: 59 | Admitting: *Deleted

## 2019-02-06 DIAGNOSIS — J4540 Moderate persistent asthma, uncomplicated: Secondary | ICD-10-CM

## 2019-03-06 ENCOUNTER — Ambulatory Visit (INDEPENDENT_AMBULATORY_CARE_PROVIDER_SITE_OTHER): Payer: 59 | Admitting: *Deleted

## 2019-03-06 ENCOUNTER — Other Ambulatory Visit: Payer: Self-pay

## 2019-03-06 DIAGNOSIS — J4540 Moderate persistent asthma, uncomplicated: Secondary | ICD-10-CM

## 2019-04-03 ENCOUNTER — Ambulatory Visit (INDEPENDENT_AMBULATORY_CARE_PROVIDER_SITE_OTHER): Payer: 59

## 2019-04-03 ENCOUNTER — Other Ambulatory Visit: Payer: Self-pay

## 2019-04-03 DIAGNOSIS — J4540 Moderate persistent asthma, uncomplicated: Secondary | ICD-10-CM | POA: Diagnosis not present

## 2019-05-01 ENCOUNTER — Ambulatory Visit (INDEPENDENT_AMBULATORY_CARE_PROVIDER_SITE_OTHER): Payer: 59 | Admitting: *Deleted

## 2019-05-01 ENCOUNTER — Other Ambulatory Visit: Payer: Self-pay

## 2019-05-01 DIAGNOSIS — J4540 Moderate persistent asthma, uncomplicated: Secondary | ICD-10-CM

## 2019-05-25 ENCOUNTER — Ambulatory Visit (INDEPENDENT_AMBULATORY_CARE_PROVIDER_SITE_OTHER): Payer: 59

## 2019-05-25 ENCOUNTER — Other Ambulatory Visit: Payer: Self-pay

## 2019-05-25 DIAGNOSIS — J4540 Moderate persistent asthma, uncomplicated: Secondary | ICD-10-CM

## 2019-05-29 ENCOUNTER — Ambulatory Visit: Payer: Self-pay

## 2019-06-22 ENCOUNTER — Ambulatory Visit (INDEPENDENT_AMBULATORY_CARE_PROVIDER_SITE_OTHER): Payer: 59 | Admitting: *Deleted

## 2019-06-22 ENCOUNTER — Other Ambulatory Visit: Payer: Self-pay

## 2019-06-22 DIAGNOSIS — J4540 Moderate persistent asthma, uncomplicated: Secondary | ICD-10-CM

## 2019-07-20 ENCOUNTER — Ambulatory Visit: Payer: Self-pay

## 2019-07-24 ENCOUNTER — Ambulatory Visit (INDEPENDENT_AMBULATORY_CARE_PROVIDER_SITE_OTHER): Payer: 59 | Admitting: *Deleted

## 2019-07-24 DIAGNOSIS — J4540 Moderate persistent asthma, uncomplicated: Secondary | ICD-10-CM

## 2019-08-21 ENCOUNTER — Ambulatory Visit (INDEPENDENT_AMBULATORY_CARE_PROVIDER_SITE_OTHER): Payer: 59 | Admitting: *Deleted

## 2019-08-21 ENCOUNTER — Other Ambulatory Visit: Payer: Self-pay

## 2019-08-21 DIAGNOSIS — J4540 Moderate persistent asthma, uncomplicated: Secondary | ICD-10-CM | POA: Diagnosis not present

## 2019-09-18 ENCOUNTER — Other Ambulatory Visit: Payer: Self-pay

## 2019-09-18 ENCOUNTER — Ambulatory Visit (INDEPENDENT_AMBULATORY_CARE_PROVIDER_SITE_OTHER): Payer: 59 | Admitting: *Deleted

## 2019-09-18 DIAGNOSIS — J4540 Moderate persistent asthma, uncomplicated: Secondary | ICD-10-CM

## 2019-10-16 ENCOUNTER — Ambulatory Visit: Payer: Self-pay

## 2019-10-16 ENCOUNTER — Ambulatory Visit (INDEPENDENT_AMBULATORY_CARE_PROVIDER_SITE_OTHER): Payer: 59

## 2019-10-16 DIAGNOSIS — J4540 Moderate persistent asthma, uncomplicated: Secondary | ICD-10-CM | POA: Diagnosis not present

## 2019-11-13 ENCOUNTER — Telehealth: Payer: Self-pay | Admitting: Allergy and Immunology

## 2019-11-13 ENCOUNTER — Other Ambulatory Visit: Payer: Self-pay

## 2019-11-13 ENCOUNTER — Ambulatory Visit (INDEPENDENT_AMBULATORY_CARE_PROVIDER_SITE_OTHER): Payer: 59

## 2019-11-13 DIAGNOSIS — J4540 Moderate persistent asthma, uncomplicated: Secondary | ICD-10-CM | POA: Diagnosis not present

## 2019-11-13 NOTE — Telephone Encounter (Signed)
Devon Gordon came into the office today for Xolair and asked about some charges.  He didn't give specific dates but he states that his Xolair charges used to be $50 and then for a while he was being charged over $200 for them.  He noticed they were being billed as chemo therapy.  He says they are now $50 but labeled as something else and just wants to make sure they are being coded correctly so he doesn't get charged over $200 again.

## 2019-11-14 NOTE — Telephone Encounter (Signed)
I have talked with Devon Gordon several times about this issue. I just talked with him again today. He needs to talk with his insurance about the different ways it is being processed. We are coding it right on our end.  Thanks.

## 2019-12-11 ENCOUNTER — Ambulatory Visit (INDEPENDENT_AMBULATORY_CARE_PROVIDER_SITE_OTHER): Payer: 59 | Admitting: *Deleted

## 2019-12-11 ENCOUNTER — Other Ambulatory Visit: Payer: Self-pay

## 2019-12-11 DIAGNOSIS — J4540 Moderate persistent asthma, uncomplicated: Secondary | ICD-10-CM

## 2020-01-08 ENCOUNTER — Ambulatory Visit (INDEPENDENT_AMBULATORY_CARE_PROVIDER_SITE_OTHER): Payer: 59 | Admitting: *Deleted

## 2020-01-08 DIAGNOSIS — J4540 Moderate persistent asthma, uncomplicated: Secondary | ICD-10-CM

## 2020-02-05 ENCOUNTER — Ambulatory Visit: Payer: 59

## 2020-02-07 ENCOUNTER — Ambulatory Visit: Payer: 59

## 2020-02-12 ENCOUNTER — Other Ambulatory Visit: Payer: Self-pay

## 2020-02-12 ENCOUNTER — Ambulatory Visit (INDEPENDENT_AMBULATORY_CARE_PROVIDER_SITE_OTHER): Payer: 59 | Admitting: *Deleted

## 2020-02-12 DIAGNOSIS — J4540 Moderate persistent asthma, uncomplicated: Secondary | ICD-10-CM

## 2020-03-11 ENCOUNTER — Ambulatory Visit (INDEPENDENT_AMBULATORY_CARE_PROVIDER_SITE_OTHER): Payer: 59 | Admitting: *Deleted

## 2020-03-11 ENCOUNTER — Telehealth: Payer: Self-pay | Admitting: Allergy and Immunology

## 2020-03-11 ENCOUNTER — Other Ambulatory Visit: Payer: Self-pay

## 2020-03-11 DIAGNOSIS — J4540 Moderate persistent asthma, uncomplicated: Secondary | ICD-10-CM

## 2020-03-11 NOTE — Telephone Encounter (Signed)
[name] has an Conservation officer, nature.  I don't think it's a huge difference.  I scanned it in and updated it in his chart.

## 2020-03-11 NOTE — Telephone Encounter (Signed)
Will look into to see approval needed

## 2020-04-08 ENCOUNTER — Ambulatory Visit (INDEPENDENT_AMBULATORY_CARE_PROVIDER_SITE_OTHER): Payer: 59 | Admitting: *Deleted

## 2020-04-08 ENCOUNTER — Other Ambulatory Visit: Payer: Self-pay

## 2020-04-08 DIAGNOSIS — J4540 Moderate persistent asthma, uncomplicated: Secondary | ICD-10-CM

## 2020-05-06 ENCOUNTER — Other Ambulatory Visit: Payer: Self-pay

## 2020-05-06 ENCOUNTER — Ambulatory Visit (INDEPENDENT_AMBULATORY_CARE_PROVIDER_SITE_OTHER): Payer: 59 | Admitting: *Deleted

## 2020-05-06 DIAGNOSIS — J4540 Moderate persistent asthma, uncomplicated: Secondary | ICD-10-CM

## 2020-06-04 ENCOUNTER — Ambulatory Visit (INDEPENDENT_AMBULATORY_CARE_PROVIDER_SITE_OTHER): Payer: 59 | Admitting: *Deleted

## 2020-06-04 ENCOUNTER — Other Ambulatory Visit: Payer: Self-pay

## 2020-06-04 DIAGNOSIS — J4540 Moderate persistent asthma, uncomplicated: Secondary | ICD-10-CM | POA: Diagnosis not present

## 2020-06-06 ENCOUNTER — Encounter: Payer: Self-pay | Admitting: Allergy and Immunology

## 2020-06-06 ENCOUNTER — Ambulatory Visit (INDEPENDENT_AMBULATORY_CARE_PROVIDER_SITE_OTHER): Payer: 59 | Admitting: Allergy and Immunology

## 2020-06-06 VITALS — BP 122/82 | HR 60 | Resp 14 | Ht 66.7 in | Wt 187.6 lb

## 2020-06-06 DIAGNOSIS — J339 Nasal polyp, unspecified: Secondary | ICD-10-CM

## 2020-06-06 DIAGNOSIS — Z886 Allergy status to analgesic agent status: Secondary | ICD-10-CM | POA: Diagnosis not present

## 2020-06-06 DIAGNOSIS — J45909 Unspecified asthma, uncomplicated: Secondary | ICD-10-CM

## 2020-06-06 MED ORDER — EPINEPHRINE 0.3 MG/0.3ML IJ SOAJ: INTRAMUSCULAR | 3 refills | Status: DC

## 2020-06-06 MED ORDER — ALBUTEROL SULFATE HFA 108 (90 BASE) MCG/ACT IN AERS: INHALATION_SPRAY | RESPIRATORY_TRACT | 1 refills | Status: DC

## 2020-06-06 NOTE — Patient Instructions (Signed)
°  1.  Continue omalizumab injections (and Epi-Pen)  2.  Continue Flonase 1-2 sprays each nostril 1 time per day  3.  Continue montelukast 10 mg tablet 1 time per day  4.  Continue Proventil HFA if needed  5.  Obtain fall flu vaccine  6.  Return to clinic in 1 year or earlier if problem
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
## 2020-06-10 ENCOUNTER — Encounter: Payer: Self-pay | Admitting: Allergy and Immunology

## 2020-07-02 ENCOUNTER — Other Ambulatory Visit: Payer: Self-pay

## 2020-07-02 ENCOUNTER — Ambulatory Visit (INDEPENDENT_AMBULATORY_CARE_PROVIDER_SITE_OTHER): Payer: 59 | Admitting: *Deleted

## 2020-07-02 DIAGNOSIS — J4540 Moderate persistent asthma, uncomplicated: Secondary | ICD-10-CM | POA: Diagnosis not present

## 2020-07-30 ENCOUNTER — Other Ambulatory Visit: Payer: Self-pay

## 2020-07-30 ENCOUNTER — Ambulatory Visit (INDEPENDENT_AMBULATORY_CARE_PROVIDER_SITE_OTHER): Payer: 59 | Admitting: *Deleted

## 2020-07-30 DIAGNOSIS — J4540 Moderate persistent asthma, uncomplicated: Secondary | ICD-10-CM

## 2020-08-27 ENCOUNTER — Ambulatory Visit (INDEPENDENT_AMBULATORY_CARE_PROVIDER_SITE_OTHER): Payer: 59

## 2020-08-27 ENCOUNTER — Other Ambulatory Visit: Payer: Self-pay

## 2020-08-27 DIAGNOSIS — J4540 Moderate persistent asthma, uncomplicated: Secondary | ICD-10-CM

## 2020-09-16 ENCOUNTER — Other Ambulatory Visit: Payer: Self-pay | Admitting: *Deleted

## 2020-09-16 MED ORDER — XOLAIR 150 MG ~~LOC~~ SOLR: SUBCUTANEOUS | 11 refills | Status: DC

## 2020-09-24 ENCOUNTER — Ambulatory Visit (INDEPENDENT_AMBULATORY_CARE_PROVIDER_SITE_OTHER): Payer: 59

## 2020-09-24 ENCOUNTER — Ambulatory Visit: Payer: Self-pay

## 2020-09-24 DIAGNOSIS — J4540 Moderate persistent asthma, uncomplicated: Secondary | ICD-10-CM | POA: Diagnosis not present

## 2020-10-22 ENCOUNTER — Other Ambulatory Visit: Payer: Self-pay

## 2020-10-22 ENCOUNTER — Ambulatory Visit (INDEPENDENT_AMBULATORY_CARE_PROVIDER_SITE_OTHER): Payer: 59

## 2020-10-22 DIAGNOSIS — J4540 Moderate persistent asthma, uncomplicated: Secondary | ICD-10-CM | POA: Diagnosis not present

## 2020-11-19 ENCOUNTER — Ambulatory Visit (INDEPENDENT_AMBULATORY_CARE_PROVIDER_SITE_OTHER): Payer: 59 | Admitting: *Deleted

## 2020-11-19 ENCOUNTER — Other Ambulatory Visit: Payer: Self-pay

## 2020-11-19 DIAGNOSIS — J4540 Moderate persistent asthma, uncomplicated: Secondary | ICD-10-CM | POA: Diagnosis not present

## 2020-12-17 ENCOUNTER — Ambulatory Visit (INDEPENDENT_AMBULATORY_CARE_PROVIDER_SITE_OTHER): Payer: 59

## 2020-12-17 ENCOUNTER — Other Ambulatory Visit: Payer: Self-pay

## 2020-12-17 DIAGNOSIS — J4540 Moderate persistent asthma, uncomplicated: Secondary | ICD-10-CM

## 2021-01-14 ENCOUNTER — Ambulatory Visit (INDEPENDENT_AMBULATORY_CARE_PROVIDER_SITE_OTHER): Payer: 59

## 2021-01-14 ENCOUNTER — Other Ambulatory Visit: Payer: Self-pay

## 2021-01-14 DIAGNOSIS — J4540 Moderate persistent asthma, uncomplicated: Secondary | ICD-10-CM

## 2021-02-11 ENCOUNTER — Ambulatory Visit (INDEPENDENT_AMBULATORY_CARE_PROVIDER_SITE_OTHER): Payer: Self-pay

## 2021-02-11 DIAGNOSIS — J4540 Moderate persistent asthma, uncomplicated: Secondary | ICD-10-CM

## 2021-03-05 ENCOUNTER — Encounter: Payer: Self-pay | Admitting: *Deleted

## 2021-03-11 ENCOUNTER — Ambulatory Visit (INDEPENDENT_AMBULATORY_CARE_PROVIDER_SITE_OTHER): Payer: BC Managed Care – PPO

## 2021-03-11 ENCOUNTER — Other Ambulatory Visit: Payer: Self-pay

## 2021-03-11 DIAGNOSIS — J4540 Moderate persistent asthma, uncomplicated: Secondary | ICD-10-CM

## 2021-04-08 ENCOUNTER — Ambulatory Visit (INDEPENDENT_AMBULATORY_CARE_PROVIDER_SITE_OTHER): Payer: BC Managed Care – PPO

## 2021-04-08 ENCOUNTER — Other Ambulatory Visit: Payer: Self-pay

## 2021-04-08 DIAGNOSIS — J4540 Moderate persistent asthma, uncomplicated: Secondary | ICD-10-CM

## 2021-04-08 MED ORDER — OMALIZUMAB 150 MG/ML ~~LOC~~ SOSY
300.0000 mg | PREFILLED_SYRINGE | SUBCUTANEOUS | Status: DC
  Administered 2021-04-08 – 2022-09-29 (×16): 300 mg via SUBCUTANEOUS

## 2021-05-06 ENCOUNTER — Ambulatory Visit (INDEPENDENT_AMBULATORY_CARE_PROVIDER_SITE_OTHER): Payer: BC Managed Care – PPO

## 2021-05-06 DIAGNOSIS — J4540 Moderate persistent asthma, uncomplicated: Secondary | ICD-10-CM

## 2021-06-03 ENCOUNTER — Other Ambulatory Visit: Payer: Self-pay

## 2021-06-03 ENCOUNTER — Ambulatory Visit (INDEPENDENT_AMBULATORY_CARE_PROVIDER_SITE_OTHER): Payer: BC Managed Care – PPO | Admitting: *Deleted

## 2021-06-03 DIAGNOSIS — J4540 Moderate persistent asthma, uncomplicated: Secondary | ICD-10-CM

## 2021-07-01 ENCOUNTER — Ambulatory Visit (INDEPENDENT_AMBULATORY_CARE_PROVIDER_SITE_OTHER): Payer: BC Managed Care – PPO

## 2021-07-01 DIAGNOSIS — J4540 Moderate persistent asthma, uncomplicated: Secondary | ICD-10-CM | POA: Diagnosis not present

## 2021-07-29 ENCOUNTER — Ambulatory Visit (INDEPENDENT_AMBULATORY_CARE_PROVIDER_SITE_OTHER): Payer: BC Managed Care – PPO | Admitting: *Deleted

## 2021-07-29 ENCOUNTER — Other Ambulatory Visit: Payer: Self-pay

## 2021-07-29 DIAGNOSIS — J4540 Moderate persistent asthma, uncomplicated: Secondary | ICD-10-CM | POA: Diagnosis not present

## 2021-08-25 DIAGNOSIS — J4540 Moderate persistent asthma, uncomplicated: Secondary | ICD-10-CM | POA: Diagnosis not present

## 2021-08-26 ENCOUNTER — Ambulatory Visit (INDEPENDENT_AMBULATORY_CARE_PROVIDER_SITE_OTHER): Payer: Medicare Other

## 2021-08-26 ENCOUNTER — Other Ambulatory Visit: Payer: Self-pay

## 2021-08-26 DIAGNOSIS — J4540 Moderate persistent asthma, uncomplicated: Secondary | ICD-10-CM

## 2021-09-22 DIAGNOSIS — J4540 Moderate persistent asthma, uncomplicated: Secondary | ICD-10-CM

## 2021-09-23 ENCOUNTER — Other Ambulatory Visit: Payer: Self-pay

## 2021-09-23 ENCOUNTER — Ambulatory Visit (INDEPENDENT_AMBULATORY_CARE_PROVIDER_SITE_OTHER): Payer: Medicare Other | Admitting: *Deleted

## 2021-09-23 DIAGNOSIS — J4540 Moderate persistent asthma, uncomplicated: Secondary | ICD-10-CM | POA: Diagnosis not present

## 2021-09-23 DIAGNOSIS — J4530 Mild persistent asthma, uncomplicated: Secondary | ICD-10-CM

## 2021-10-20 DIAGNOSIS — J4540 Moderate persistent asthma, uncomplicated: Secondary | ICD-10-CM | POA: Diagnosis not present

## 2021-10-21 ENCOUNTER — Other Ambulatory Visit: Payer: Self-pay

## 2021-10-21 ENCOUNTER — Ambulatory Visit (INDEPENDENT_AMBULATORY_CARE_PROVIDER_SITE_OTHER): Payer: Medicare Other | Admitting: *Deleted

## 2021-10-21 DIAGNOSIS — J4540 Moderate persistent asthma, uncomplicated: Secondary | ICD-10-CM | POA: Diagnosis not present

## 2021-11-17 DIAGNOSIS — J4540 Moderate persistent asthma, uncomplicated: Secondary | ICD-10-CM | POA: Diagnosis not present

## 2021-11-18 ENCOUNTER — Ambulatory Visit (INDEPENDENT_AMBULATORY_CARE_PROVIDER_SITE_OTHER): Payer: Medicare Other

## 2021-11-18 ENCOUNTER — Other Ambulatory Visit: Payer: Self-pay

## 2021-11-18 DIAGNOSIS — J4540 Moderate persistent asthma, uncomplicated: Secondary | ICD-10-CM

## 2021-12-15 DIAGNOSIS — J4540 Moderate persistent asthma, uncomplicated: Secondary | ICD-10-CM | POA: Diagnosis not present

## 2021-12-16 ENCOUNTER — Ambulatory Visit (INDEPENDENT_AMBULATORY_CARE_PROVIDER_SITE_OTHER): Payer: Medicare Other

## 2021-12-16 ENCOUNTER — Other Ambulatory Visit: Payer: Self-pay

## 2021-12-16 DIAGNOSIS — J4540 Moderate persistent asthma, uncomplicated: Secondary | ICD-10-CM

## 2022-01-13 ENCOUNTER — Ambulatory Visit (INDEPENDENT_AMBULATORY_CARE_PROVIDER_SITE_OTHER): Payer: Medicare Other | Admitting: *Deleted

## 2022-01-13 ENCOUNTER — Other Ambulatory Visit: Payer: Self-pay

## 2022-01-13 DIAGNOSIS — J4540 Moderate persistent asthma, uncomplicated: Secondary | ICD-10-CM | POA: Diagnosis not present

## 2022-02-09 DIAGNOSIS — J4540 Moderate persistent asthma, uncomplicated: Secondary | ICD-10-CM | POA: Diagnosis not present

## 2022-02-10 ENCOUNTER — Other Ambulatory Visit: Payer: Self-pay

## 2022-02-10 ENCOUNTER — Ambulatory Visit (INDEPENDENT_AMBULATORY_CARE_PROVIDER_SITE_OTHER): Payer: Medicare Other | Admitting: *Deleted

## 2022-02-10 DIAGNOSIS — J4540 Moderate persistent asthma, uncomplicated: Secondary | ICD-10-CM | POA: Diagnosis not present

## 2022-03-09 DIAGNOSIS — J4540 Moderate persistent asthma, uncomplicated: Secondary | ICD-10-CM

## 2022-03-10 ENCOUNTER — Ambulatory Visit (INDEPENDENT_AMBULATORY_CARE_PROVIDER_SITE_OTHER): Payer: Medicare Other | Admitting: *Deleted

## 2022-03-10 DIAGNOSIS — J4540 Moderate persistent asthma, uncomplicated: Secondary | ICD-10-CM | POA: Diagnosis not present

## 2022-04-07 ENCOUNTER — Ambulatory Visit: Payer: Medicare Other

## 2022-04-08 DIAGNOSIS — J4540 Moderate persistent asthma, uncomplicated: Secondary | ICD-10-CM | POA: Diagnosis not present

## 2022-04-09 ENCOUNTER — Ambulatory Visit (INDEPENDENT_AMBULATORY_CARE_PROVIDER_SITE_OTHER): Payer: Medicare Other | Admitting: *Deleted

## 2022-04-09 DIAGNOSIS — J4540 Moderate persistent asthma, uncomplicated: Secondary | ICD-10-CM

## 2022-05-07 ENCOUNTER — Ambulatory Visit (INDEPENDENT_AMBULATORY_CARE_PROVIDER_SITE_OTHER): Payer: Medicare Other | Admitting: *Deleted

## 2022-05-07 DIAGNOSIS — J4540 Moderate persistent asthma, uncomplicated: Secondary | ICD-10-CM | POA: Diagnosis not present

## 2022-06-03 DIAGNOSIS — J4540 Moderate persistent asthma, uncomplicated: Secondary | ICD-10-CM

## 2022-06-04 ENCOUNTER — Ambulatory Visit (INDEPENDENT_AMBULATORY_CARE_PROVIDER_SITE_OTHER): Payer: Medicare Other | Admitting: *Deleted

## 2022-06-04 DIAGNOSIS — J4540 Moderate persistent asthma, uncomplicated: Secondary | ICD-10-CM | POA: Diagnosis not present

## 2022-07-02 ENCOUNTER — Ambulatory Visit: Payer: Medicare Other

## 2022-07-06 DIAGNOSIS — J4540 Moderate persistent asthma, uncomplicated: Secondary | ICD-10-CM | POA: Diagnosis not present

## 2022-07-07 ENCOUNTER — Ambulatory Visit (INDEPENDENT_AMBULATORY_CARE_PROVIDER_SITE_OTHER): Payer: Medicare Other | Admitting: *Deleted

## 2022-07-07 DIAGNOSIS — J4540 Moderate persistent asthma, uncomplicated: Secondary | ICD-10-CM | POA: Diagnosis not present

## 2022-08-03 DIAGNOSIS — J4540 Moderate persistent asthma, uncomplicated: Secondary | ICD-10-CM | POA: Diagnosis not present

## 2022-08-04 ENCOUNTER — Ambulatory Visit (INDEPENDENT_AMBULATORY_CARE_PROVIDER_SITE_OTHER): Payer: Medicare Other | Admitting: *Deleted

## 2022-08-04 DIAGNOSIS — J4540 Moderate persistent asthma, uncomplicated: Secondary | ICD-10-CM | POA: Diagnosis not present

## 2022-08-31 DIAGNOSIS — J4540 Moderate persistent asthma, uncomplicated: Secondary | ICD-10-CM | POA: Diagnosis not present

## 2022-09-01 ENCOUNTER — Ambulatory Visit (INDEPENDENT_AMBULATORY_CARE_PROVIDER_SITE_OTHER): Payer: Medicare Other | Admitting: *Deleted

## 2022-09-01 DIAGNOSIS — J4540 Moderate persistent asthma, uncomplicated: Secondary | ICD-10-CM

## 2022-09-28 DIAGNOSIS — J4540 Moderate persistent asthma, uncomplicated: Secondary | ICD-10-CM | POA: Diagnosis not present

## 2022-09-29 ENCOUNTER — Ambulatory Visit (INDEPENDENT_AMBULATORY_CARE_PROVIDER_SITE_OTHER): Payer: Medicare Other

## 2022-09-29 DIAGNOSIS — J4540 Moderate persistent asthma, uncomplicated: Secondary | ICD-10-CM | POA: Diagnosis not present

## 2022-10-26 DIAGNOSIS — J4540 Moderate persistent asthma, uncomplicated: Secondary | ICD-10-CM | POA: Diagnosis not present

## 2022-10-27 ENCOUNTER — Ambulatory Visit (INDEPENDENT_AMBULATORY_CARE_PROVIDER_SITE_OTHER): Payer: Medicare Other

## 2022-10-27 DIAGNOSIS — J4540 Moderate persistent asthma, uncomplicated: Secondary | ICD-10-CM | POA: Diagnosis not present

## 2022-10-27 MED ORDER — OMALIZUMAB 150 MG/ML ~~LOC~~ SOSY
300.0000 mg | PREFILLED_SYRINGE | SUBCUTANEOUS | Status: AC
  Administered 2022-10-27 – 2024-12-11 (×28): 300 mg via SUBCUTANEOUS

## 2022-10-27 MED ORDER — OMALIZUMAB 150 MG/ML ~~LOC~~ SOSY: 150.0000 mg | PREFILLED_SYRINGE | SUBCUTANEOUS | Status: DC

## 2022-11-25 ENCOUNTER — Ambulatory Visit (INDEPENDENT_AMBULATORY_CARE_PROVIDER_SITE_OTHER): Payer: Medicare Other | Admitting: *Deleted

## 2022-11-25 DIAGNOSIS — J4540 Moderate persistent asthma, uncomplicated: Secondary | ICD-10-CM | POA: Diagnosis not present

## 2022-12-22 ENCOUNTER — Ambulatory Visit (INDEPENDENT_AMBULATORY_CARE_PROVIDER_SITE_OTHER): Payer: Medicare Other | Admitting: *Deleted

## 2022-12-22 DIAGNOSIS — J4540 Moderate persistent asthma, uncomplicated: Secondary | ICD-10-CM

## 2022-12-23 ENCOUNTER — Ambulatory Visit: Payer: Medicare Other

## 2023-01-19 ENCOUNTER — Ambulatory Visit (INDEPENDENT_AMBULATORY_CARE_PROVIDER_SITE_OTHER): Payer: Medicare Other

## 2023-01-19 DIAGNOSIS — J4540 Moderate persistent asthma, uncomplicated: Secondary | ICD-10-CM | POA: Diagnosis not present

## 2023-02-16 ENCOUNTER — Ambulatory Visit (INDEPENDENT_AMBULATORY_CARE_PROVIDER_SITE_OTHER): Payer: Medicare Other

## 2023-02-16 DIAGNOSIS — J4540 Moderate persistent asthma, uncomplicated: Secondary | ICD-10-CM

## 2023-03-16 ENCOUNTER — Ambulatory Visit (INDEPENDENT_AMBULATORY_CARE_PROVIDER_SITE_OTHER): Payer: Medicare Other | Admitting: *Deleted

## 2023-03-16 DIAGNOSIS — J4540 Moderate persistent asthma, uncomplicated: Secondary | ICD-10-CM

## 2023-04-13 ENCOUNTER — Ambulatory Visit (INDEPENDENT_AMBULATORY_CARE_PROVIDER_SITE_OTHER): Payer: Medicare Other | Admitting: *Deleted

## 2023-04-13 DIAGNOSIS — J4540 Moderate persistent asthma, uncomplicated: Secondary | ICD-10-CM | POA: Diagnosis not present

## 2023-05-11 ENCOUNTER — Ambulatory Visit (INDEPENDENT_AMBULATORY_CARE_PROVIDER_SITE_OTHER): Payer: Medicare Other

## 2023-05-11 DIAGNOSIS — J4540 Moderate persistent asthma, uncomplicated: Secondary | ICD-10-CM

## 2023-06-08 ENCOUNTER — Ambulatory Visit: Payer: Medicare Other

## 2023-06-15 ENCOUNTER — Ambulatory Visit: Payer: Medicare Other

## 2023-06-17 ENCOUNTER — Ambulatory Visit (INDEPENDENT_AMBULATORY_CARE_PROVIDER_SITE_OTHER): Payer: Medicare Other | Admitting: *Deleted

## 2023-06-17 DIAGNOSIS — J4540 Moderate persistent asthma, uncomplicated: Secondary | ICD-10-CM

## 2023-07-15 ENCOUNTER — Ambulatory Visit (INDEPENDENT_AMBULATORY_CARE_PROVIDER_SITE_OTHER): Payer: Medicare Other | Admitting: *Deleted

## 2023-07-15 DIAGNOSIS — J4540 Moderate persistent asthma, uncomplicated: Secondary | ICD-10-CM | POA: Diagnosis not present

## 2023-08-12 ENCOUNTER — Ambulatory Visit (INDEPENDENT_AMBULATORY_CARE_PROVIDER_SITE_OTHER): Payer: Medicare Other | Admitting: *Deleted

## 2023-08-12 DIAGNOSIS — J4540 Moderate persistent asthma, uncomplicated: Secondary | ICD-10-CM

## 2023-09-09 ENCOUNTER — Ambulatory Visit: Payer: Medicare Other

## 2023-09-09 DIAGNOSIS — J4540 Moderate persistent asthma, uncomplicated: Secondary | ICD-10-CM | POA: Diagnosis not present

## 2023-10-06 ENCOUNTER — Ambulatory Visit: Payer: Medicare Other

## 2023-10-06 DIAGNOSIS — J4540 Moderate persistent asthma, uncomplicated: Secondary | ICD-10-CM | POA: Diagnosis not present

## 2023-10-07 ENCOUNTER — Ambulatory Visit: Payer: Medicare Other

## 2023-11-03 ENCOUNTER — Ambulatory Visit (INDEPENDENT_AMBULATORY_CARE_PROVIDER_SITE_OTHER): Payer: Medicare Other | Admitting: *Deleted

## 2023-11-03 DIAGNOSIS — J4540 Moderate persistent asthma, uncomplicated: Secondary | ICD-10-CM | POA: Diagnosis not present

## 2023-12-02 ENCOUNTER — Ambulatory Visit: Payer: Medicare Other

## 2023-12-07 ENCOUNTER — Ambulatory Visit (INDEPENDENT_AMBULATORY_CARE_PROVIDER_SITE_OTHER): Payer: PPO

## 2023-12-07 DIAGNOSIS — J4540 Moderate persistent asthma, uncomplicated: Secondary | ICD-10-CM | POA: Diagnosis not present

## 2023-12-21 ENCOUNTER — Telehealth: Payer: Self-pay | Admitting: *Deleted

## 2023-12-21 NOTE — Telephone Encounter (Signed)
L/m for patient due to Ins change will need to get approval with Ins and needs MD appt in order to obtain same

## 2023-12-23 ENCOUNTER — Encounter: Payer: Self-pay | Admitting: Allergy and Immunology

## 2023-12-23 ENCOUNTER — Ambulatory Visit: Payer: PPO | Admitting: Allergy and Immunology

## 2023-12-23 VITALS — BP 138/76 | HR 64 | Resp 12 | Ht 67.0 in | Wt 201.4 lb

## 2023-12-23 DIAGNOSIS — J45998 Other asthma: Secondary | ICD-10-CM | POA: Diagnosis not present

## 2023-12-23 DIAGNOSIS — Z886 Allergy status to analgesic agent status: Secondary | ICD-10-CM

## 2023-12-23 DIAGNOSIS — J339 Nasal polyp, unspecified: Secondary | ICD-10-CM | POA: Diagnosis not present

## 2023-12-23 MED ORDER — ALBUTEROL SULFATE HFA 108 (90 BASE) MCG/ACT IN AERS: INHALATION_SPRAY | RESPIRATORY_TRACT | 1 refills | Status: DC

## 2023-12-23 MED ORDER — EPINEPHRINE 0.3 MG/0.3ML IJ SOAJ: INTRAMUSCULAR | 3 refills | Status: AC
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
## 2023-12-23 NOTE — Patient Instructions (Signed)
  1.  Continue omalizumab injections (EpiPen)  2.  Continue Flonase 1-2 sprays each nostril daily  3.  Continue montelukast 10 mg daily  4.  Use albuterol HFA 2 inhalations every 4-6 hours if needed  5.  Influenza = Tamiflu.  COVID = Paxlovid.  6. Return to clinic in 1 year or earlier if problem

## 2023-12-24 NOTE — Telephone Encounter (Signed)
Spoke to patient. Will get approval and send Rx to [facility]

## 2023-12-27 ENCOUNTER — Other Ambulatory Visit: Payer: Self-pay

## 2023-12-27 ENCOUNTER — Other Ambulatory Visit (HOSPITAL_COMMUNITY): Payer: Self-pay

## 2023-12-27 ENCOUNTER — Encounter: Payer: Self-pay | Admitting: Allergy and Immunology

## 2023-12-27 MED ORDER — OMALIZUMAB 150 MG/ML ~~LOC~~ SOSY
300.0000 mg | PREFILLED_SYRINGE | SUBCUTANEOUS | 11 refills | Status: DC
  Filled 2023-12-27: qty 2, 28d supply, fill #0
  Filled 2024-01-13: qty 2, 28d supply, fill #1
  Filled 2024-02-16 (×2): qty 2, 28d supply, fill #2
  Filled 2024-03-17: qty 2, 28d supply, fill #3
  Filled 2024-04-12 (×2): qty 2, 28d supply, fill #4
  Filled 2024-05-16: qty 2, 28d supply, fill #5
  Filled 2024-06-16: qty 2, 28d supply, fill #6
  Filled 2024-07-14: qty 2, 28d supply, fill #7
  Filled 2024-08-11: qty 2, 28d supply, fill #8
  Filled 2024-09-11: qty 2, 28d supply, fill #9
  Filled 2024-09-27: qty 2, 28d supply, fill #10
  Filled 2024-11-01: qty 2, 28d supply, fill #11

## 2023-12-27 NOTE — Progress Notes (Signed)
Specialty Pharmacy Initiation Note   Devon Gordon is a 68 y.o. male who will be followed by the specialty pharmacy service for RxSp Asthma/COPD   Patient has been on therapy for years, but is new to our pharmacy.    Review of administration, indication, effectiveness, safety, potential side effects, storage/disposable, and missed dose instructions occurred today for patient's specialty medication(s) Omalizumab Devon Gordon)     Patient/Caregiver did not have any additional questions or concerns.   Patient's therapy is appropriate to: Continue    Goals Addressed             This Visit's Progress    Minimize recurrence of flares       Patient is on track. Patient will maintain adherence.          Servando Snare Specialty Pharmacist

## 2023-12-27 NOTE — Progress Notes (Signed)
Specialty Pharmacy Initial Fill Coordination Note  Devon Gordon is a 68 y.o. male contacted today regarding initial fill of specialty medication(s) Omalizumab [name])   Patient requested Courier to Provider Office   Delivery date: 12/29/23   Verified address: [address] [place] [address]   Medication will be filled on 01/28.   Patient is aware of $896.21 copayment.

## 2023-12-27 NOTE — Addendum Note (Signed)
Addended by: Devoria Glassing on: 12/27/2023 11:07 AM   Modules accepted: Orders

## 2023-12-28 ENCOUNTER — Other Ambulatory Visit: Payer: Self-pay

## 2024-01-04 ENCOUNTER — Ambulatory Visit (INDEPENDENT_AMBULATORY_CARE_PROVIDER_SITE_OTHER): Payer: Self-pay | Admitting: *Deleted

## 2024-01-04 DIAGNOSIS — J4540 Moderate persistent asthma, uncomplicated: Secondary | ICD-10-CM

## 2024-01-13 ENCOUNTER — Other Ambulatory Visit (HOSPITAL_COMMUNITY): Payer: Self-pay

## 2024-01-13 ENCOUNTER — Other Ambulatory Visit: Payer: Self-pay

## 2024-01-13 NOTE — Progress Notes (Signed)
Specialty Pharmacy Refill Coordination Note  Devon Gordon is a 68 y.o. male contacted today regarding refills of specialty medication(s) Omalizumab [name])   Patient requested Courier to Provider Office   Delivery date: 01/26/24   Verified address: [facility] [address]   [place] [address]   Medication will be filled on 01/25/24.

## 2024-01-25 ENCOUNTER — Other Ambulatory Visit: Payer: Self-pay

## 2024-02-01 ENCOUNTER — Ambulatory Visit: Payer: PPO

## 2024-02-01 DIAGNOSIS — J4540 Moderate persistent asthma, uncomplicated: Secondary | ICD-10-CM | POA: Diagnosis not present

## 2024-02-15 ENCOUNTER — Ambulatory Visit (HOSPITAL_BASED_OUTPATIENT_CLINIC_OR_DEPARTMENT_OTHER)
Admission: EM | Admit: 2024-02-15 | Discharge: 2024-02-15 | Disposition: A | Discharge status: Home / Self Care | Attending: Family Medicine | Admitting: Family Medicine

## 2024-02-15 ENCOUNTER — Ambulatory Visit (INDEPENDENT_AMBULATORY_CARE_PROVIDER_SITE_OTHER)
Admit: 2024-02-15 | Discharge: 2024-02-15 | Disposition: A | Discharge status: Home / Self Care | Attending: Family Medicine | Admitting: Family Medicine

## 2024-02-15 ENCOUNTER — Encounter (HOSPITAL_BASED_OUTPATIENT_CLINIC_OR_DEPARTMENT_OTHER): Payer: Self-pay | Admitting: Emergency Medicine

## 2024-02-15 ENCOUNTER — Other Ambulatory Visit: Payer: Self-pay

## 2024-02-15 DIAGNOSIS — R509 Fever, unspecified: Secondary | ICD-10-CM

## 2024-02-15 DIAGNOSIS — J208 Acute bronchitis due to other specified organisms: Secondary | ICD-10-CM | POA: Diagnosis not present

## 2024-02-15 DIAGNOSIS — R051 Acute cough: Secondary | ICD-10-CM | POA: Diagnosis not present

## 2024-02-15 DIAGNOSIS — R059 Cough, unspecified: Secondary | ICD-10-CM | POA: Diagnosis not present

## 2024-02-15 DIAGNOSIS — J101 Influenza due to other identified influenza virus with other respiratory manifestations: Secondary | ICD-10-CM

## 2024-02-15 LAB — POC COVID19/FLU A&B COMBO
Covid Antigen, POC: NEGATIVE
Influenza A Antigen, POC: POSITIVE — AB
Influenza B Antigen, POC: NEGATIVE

## 2024-02-15 MED ORDER — OSELTAMIVIR PHOSPHATE 75 MG PO CAPS: 75.0000 mg | ORAL_CAPSULE | Freq: Two times a day (BID) | ORAL | 0 refills | Status: AC

## 2024-02-15 MED ORDER — ACETAMINOPHEN 325 MG PO TABS
650.0000 mg | ORAL_TABLET | Freq: Once | ORAL | Status: AC
  Administered 2024-02-15: 650 mg via ORAL

## 2024-02-15 MED ORDER — ALBUTEROL SULFATE HFA 108 (90 BASE) MCG/ACT IN AERS: INHALATION_SPRAY | RESPIRATORY_TRACT | 1 refills | Status: DC

## 2024-02-15 MED ORDER — IPRATROPIUM-ALBUTEROL 0.5-2.5 (3) MG/3ML IN SOLN
3.0000 mL | Freq: Once | RESPIRATORY_TRACT | Status: AC
  Administered 2024-02-15: 3 mL via RESPIRATORY_TRACT

## 2024-02-15 MED ORDER — ALBUTEROL SULFATE HFA 108 (90 BASE) MCG/ACT IN AERS: INHALATION_SPRAY | RESPIRATORY_TRACT | 1 refills | Status: AC

## 2024-02-15 MED ORDER — PROMETHAZINE-DM 6.25-15 MG/5ML PO SYRP: 5.0000 mL | ORAL_SOLUTION | Freq: Four times a day (QID) | ORAL | 0 refills | Status: DC | PRN

## 2024-02-15 MED ORDER — COMPACT SPACE CHAMBER DEVI: 0 refills | Status: AC
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
## 2024-02-15 NOTE — ED Triage Notes (Signed)
 Pt c/o chest congestion, head pain when he coughs, coughing x 2 days worsened last night

## 2024-02-15 NOTE — Discharge Instructions (Addendum)
 Chest x-ray appears normal or negative.  Will update the patient if the radiology review differs.  He is positive for influenza type a and negative for flu type B and COVID.  Will treat with Tamiflu, 75 mg twice daily for 5 days.  Use Promethazine DM, 5 mL, every 6 hours as needed for cough.  Get plenty of fluids and rest.  Use an albuterol inhaler with spacer, every 4 hours, 2 puffs if needed for wheezing.  Follow-up if symptoms do not improve, worsen or new symptoms occur.  If any chest congestion and I will follow-up here or with primary care in 2 to 3 weeks for recheck.

## 2024-02-15 NOTE — Progress Notes (Signed)
 Chest X- Ray IMPRESSION:  No active cardiopulmonary disease.  Patient was updated on these results during the Urgent Care visit.

## 2024-02-16 ENCOUNTER — Other Ambulatory Visit (HOSPITAL_COMMUNITY): Payer: Self-pay

## 2024-02-16 ENCOUNTER — Other Ambulatory Visit: Payer: Self-pay

## 2024-02-16 NOTE — Progress Notes (Signed)
 Specialty Pharmacy Refill Coordination Note  Alder Murri is a 68 y.o. male contacted today regarding refills of specialty medication(s) Omalizumab [name])   Patient requested Courier to Provider Office   Delivery date: 02/23/24   Verified address: [facility] [address]   [place] [address]   Medication will be filled on 02/22/24.

## 2024-02-20 ENCOUNTER — Encounter (HOSPITAL_BASED_OUTPATIENT_CLINIC_OR_DEPARTMENT_OTHER): Payer: Self-pay | Admitting: Emergency Medicine

## 2024-02-20 ENCOUNTER — Ambulatory Visit (HOSPITAL_BASED_OUTPATIENT_CLINIC_OR_DEPARTMENT_OTHER)
Admission: EM | Admit: 2024-02-20 | Discharge: 2024-02-20 | Disposition: A | Discharge status: Home / Self Care | Attending: Family Medicine | Admitting: Family Medicine

## 2024-02-20 ENCOUNTER — Ambulatory Visit (INDEPENDENT_AMBULATORY_CARE_PROVIDER_SITE_OTHER)
Admit: 2024-02-20 | Discharge: 2024-02-20 | Disposition: A | Discharge status: Home / Self Care | Payer: Self-pay | Attending: Family Medicine | Admitting: Family Medicine

## 2024-02-20 DIAGNOSIS — R051 Acute cough: Secondary | ICD-10-CM

## 2024-02-20 DIAGNOSIS — J0100 Acute maxillary sinusitis, unspecified: Secondary | ICD-10-CM

## 2024-02-20 DIAGNOSIS — B370 Candidal stomatitis: Secondary | ICD-10-CM

## 2024-02-20 DIAGNOSIS — J4541 Moderate persistent asthma with (acute) exacerbation: Secondary | ICD-10-CM | POA: Diagnosis not present

## 2024-02-20 DIAGNOSIS — J45909 Unspecified asthma, uncomplicated: Secondary | ICD-10-CM

## 2024-02-20 DIAGNOSIS — J111 Influenza due to unidentified influenza virus with other respiratory manifestations: Secondary | ICD-10-CM

## 2024-02-20 MED ORDER — PREDNISONE 20 MG PO TABS: 20.0000 mg | ORAL_TABLET | Freq: Every day | ORAL | 0 refills | Status: DC

## 2024-02-20 MED ORDER — ALBUTEROL SULFATE (2.5 MG/3ML) 0.083% IN NEBU
2.5000 mg | INHALATION_SOLUTION | Freq: Four times a day (QID) | RESPIRATORY_TRACT | 12 refills | Status: AC | PRN
Start: 2024-02-20 — End: ?

## 2024-02-20 MED ORDER — DOXYCYCLINE HYCLATE 100 MG PO CAPS: 100.0000 mg | ORAL_CAPSULE | Freq: Two times a day (BID) | ORAL | 0 refills | Status: AC

## 2024-02-20 MED ORDER — IPRATROPIUM-ALBUTEROL 0.5-2.5 (3) MG/3ML IN SOLN
3.0000 mL | Freq: Once | RESPIRATORY_TRACT | Status: AC
  Administered 2024-02-20: 3 mL via RESPIRATORY_TRACT

## 2024-02-20 MED ORDER — PREDNISONE 20 MG PO TABS: ORAL_TABLET | ORAL | 0 refills | Status: DC

## 2024-02-20 MED ORDER — PROMETHAZINE-DM 6.25-15 MG/5ML PO SYRP: 5.0000 mL | ORAL_SOLUTION | Freq: Four times a day (QID) | ORAL | 0 refills | Status: AC | PRN

## 2024-02-20 MED ORDER — FLUCONAZOLE 100 MG PO TABS: ORAL_TABLET | ORAL | 1 refills | Status: AC

## 2024-02-20 NOTE — Discharge Instructions (Addendum)
 Chest still appears negative.  Will contact the patient if the radiologist provides a different interpretation of the x-ray.  At this point I am going to treat him for sinusitis and bronchitis versus an asthma exacerbation.  For sinusitis he will take doxycycline, 100 mg twice daily for 10 days.  Provided prednisone, 20 mg, number 2 pills daily for 3 days then number 1 pill daily for 3 days.  Refilled Promethazine DM, 5 mL every 6 hours if needed for cough.  Provided nebulizer machine for use at home.  Use either the inhaler or the nebulizer at least twice daily for several days but could use either the inhaler or nebulizer every 4 hours if needed.  He has a coated tongue or oral thrush.  Will treat with fluconazole, 100 mg daily for a week.  He should complete it wait a week and if he still has any coating on his tongue he should refill the fluconazole and take another week of medication.  Get plenty of fluids and rest.  Follow-up if symptoms do not improve, worsen or new symptoms occur.  Would benefit from a recheck here or with primary care in 2 to 3 weeks.
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
## 2024-02-20 NOTE — ED Triage Notes (Signed)
 Pt was seen 02/15/24 and dx with Flu A and is not better. He believes at this point he has a sinus infection. He has completed all the medication that was prescribed.

## 2024-02-22 ENCOUNTER — Other Ambulatory Visit: Payer: Self-pay

## 2024-02-29 ENCOUNTER — Ambulatory Visit

## 2024-02-29 DIAGNOSIS — J4540 Moderate persistent asthma, uncomplicated: Secondary | ICD-10-CM

## 2024-03-17 ENCOUNTER — Other Ambulatory Visit: Payer: Self-pay

## 2024-03-17 NOTE — Progress Notes (Signed)
 Specialty Pharmacy Refill Coordination Note  Devon Gordon is a 68 y.o. male contacted today regarding refills of specialty medication(s) Omalizumab  (XOLAIR )   Patient requested Courier to Provider Office   Delivery date: 03/23/24   Verified address: [facility] [address]   [place] [id number]   Medication will be filled on 03/22/24.

## 2024-03-22 ENCOUNTER — Other Ambulatory Visit: Payer: Self-pay

## 2024-03-28 ENCOUNTER — Ambulatory Visit (INDEPENDENT_AMBULATORY_CARE_PROVIDER_SITE_OTHER)

## 2024-03-28 DIAGNOSIS — J4540 Moderate persistent asthma, uncomplicated: Secondary | ICD-10-CM

## 2024-04-12 ENCOUNTER — Other Ambulatory Visit: Payer: Self-pay

## 2024-04-12 NOTE — Progress Notes (Signed)
 Specialty Pharmacy Refill Coordination Note  Devon Gordon is a 68 y.o. male contacted today regarding refills of specialty medication(s) Omalizumab  (XOLAIR )   Patient requested Courier to Provider Office   Delivery date: 04/20/24   Verified address: (Patient-Rpt[facility]) [address] [place], [place] [address]   Medication will be filled on 05.21.25.

## 2024-04-13 ENCOUNTER — Other Ambulatory Visit (HOSPITAL_COMMUNITY): Payer: Self-pay

## 2024-04-25 ENCOUNTER — Ambulatory Visit (INDEPENDENT_AMBULATORY_CARE_PROVIDER_SITE_OTHER)

## 2024-04-25 DIAGNOSIS — J4540 Moderate persistent asthma, uncomplicated: Secondary | ICD-10-CM | POA: Diagnosis not present

## 2024-05-16 ENCOUNTER — Other Ambulatory Visit: Payer: Self-pay

## 2024-05-16 NOTE — Progress Notes (Signed)
 Specialty Pharmacy Refill Coordination Note  Devon Gordon is a 68 y.o. male assessed today regarding refills of clinic administered specialty medication(s) Omalizumab  (XOLAIR )   Clinic requested Courier to Provider Office   Delivery date: 05/17/24   Verified address: [address] [place], [place] [address]   Medication will be filled on 05/16/24.    Appointment 05/23/24.

## 2024-05-23 ENCOUNTER — Ambulatory Visit (INDEPENDENT_AMBULATORY_CARE_PROVIDER_SITE_OTHER)

## 2024-05-23 DIAGNOSIS — J4540 Moderate persistent asthma, uncomplicated: Secondary | ICD-10-CM | POA: Diagnosis not present

## 2024-06-09 ENCOUNTER — Other Ambulatory Visit: Payer: Self-pay

## 2024-06-12 ENCOUNTER — Other Ambulatory Visit: Payer: Self-pay

## 2024-06-16 ENCOUNTER — Other Ambulatory Visit: Payer: Self-pay

## 2024-06-16 ENCOUNTER — Other Ambulatory Visit: Payer: Self-pay | Admitting: Pharmacy Technician

## 2024-06-16 NOTE — Progress Notes (Signed)
 Specialty Pharmacy Refill Coordination Note  Devon Gordon is a 68 y.o. male assessed today regarding refills of clinic administered specialty medication(s) Omalizumab  (XOLAIR )   Clinic requested Courier to Provider Office   Delivery date: 06/20/24   Verified address: [name] [address] [place], [place] [address]   Medication will be filled on 06/19/24.  Fill 7/21, Courier to MDO 7/22, for appt 7/28 @ 11:30 AM $0 co-pay

## 2024-06-19 ENCOUNTER — Other Ambulatory Visit: Payer: Self-pay

## 2024-06-20 ENCOUNTER — Ambulatory Visit

## 2024-06-26 ENCOUNTER — Ambulatory Visit (INDEPENDENT_AMBULATORY_CARE_PROVIDER_SITE_OTHER): Admitting: *Deleted

## 2024-06-26 DIAGNOSIS — J4540 Moderate persistent asthma, uncomplicated: Secondary | ICD-10-CM | POA: Diagnosis not present

## 2024-07-10 ENCOUNTER — Other Ambulatory Visit: Payer: Self-pay

## 2024-07-14 ENCOUNTER — Other Ambulatory Visit: Payer: Self-pay | Admitting: Pharmacy Technician

## 2024-07-14 ENCOUNTER — Other Ambulatory Visit: Payer: Self-pay

## 2024-07-14 NOTE — Progress Notes (Signed)
 Specialty Pharmacy Refill Coordination Note  Devon Gordon is a 68 y.o. male assessed today regarding refills of clinic administered specialty medication(s) Omalizumab (XOLAIR)   Clinic requested Courier to Provider Office   Delivery date: 07/18/24   Verified address: [facility] [place] [address] , [place] [address]   Medication will be filled on 07/17/24. Appt on 8/25 $0.00 co-pay

## 2024-07-17 ENCOUNTER — Other Ambulatory Visit: Payer: Self-pay

## 2024-07-24 ENCOUNTER — Ambulatory Visit (INDEPENDENT_AMBULATORY_CARE_PROVIDER_SITE_OTHER): Admitting: *Deleted

## 2024-07-24 DIAGNOSIS — J4540 Moderate persistent asthma, uncomplicated: Secondary | ICD-10-CM

## 2024-08-07 ENCOUNTER — Other Ambulatory Visit: Payer: Self-pay

## 2024-08-11 ENCOUNTER — Other Ambulatory Visit: Payer: Self-pay

## 2024-08-11 NOTE — Progress Notes (Signed)
 Specialty Pharmacy Refill Coordination Note  Devon Gordon is a 68 y.o. male assessed today regarding refills of clinic administered specialty medication(s) Omalizumab  (XOLAIR )   Clinic requested Courier to Provider Office   Delivery date: 08/15/24   Injection date: 08/21/24  Verified address: [facility] [place] [address] [name], [name] [address]   Medication will be filled on 08/14/24.

## 2024-08-14 ENCOUNTER — Other Ambulatory Visit: Payer: Self-pay

## 2024-08-21 ENCOUNTER — Ambulatory Visit (INDEPENDENT_AMBULATORY_CARE_PROVIDER_SITE_OTHER): Admitting: *Deleted

## 2024-08-21 DIAGNOSIS — J4540 Moderate persistent asthma, uncomplicated: Secondary | ICD-10-CM | POA: Diagnosis not present

## 2024-09-04 ENCOUNTER — Other Ambulatory Visit: Payer: Self-pay

## 2024-09-11 ENCOUNTER — Other Ambulatory Visit (HOSPITAL_COMMUNITY): Payer: Self-pay

## 2024-09-11 ENCOUNTER — Other Ambulatory Visit: Payer: Self-pay

## 2024-09-11 NOTE — Progress Notes (Signed)
 Specialty Pharmacy Refill Coordination Note  Devon Gordon is a 68 y.o. male assessed today regarding refills of clinic administered specialty medication(s) Omalizumab  (XOLAIR )   Clinic requested Courier to Provider Office   Delivery date: 09/13/24   Verified address: [facility] [place] [address] [place], [place] [address]   Medication will be filled on 09/12/24.

## 2024-09-18 ENCOUNTER — Ambulatory Visit (INDEPENDENT_AMBULATORY_CARE_PROVIDER_SITE_OTHER): Admitting: *Deleted

## 2024-09-18 DIAGNOSIS — J4540 Moderate persistent asthma, uncomplicated: Secondary | ICD-10-CM | POA: Diagnosis not present

## 2024-09-25 ENCOUNTER — Other Ambulatory Visit: Payer: Self-pay

## 2024-09-27 ENCOUNTER — Other Ambulatory Visit: Payer: Self-pay

## 2024-09-27 ENCOUNTER — Other Ambulatory Visit (HOSPITAL_COMMUNITY): Payer: Self-pay

## 2024-09-27 NOTE — Progress Notes (Signed)
 Specialty Pharmacy Refill Coordination Note  Devon Gordon is a 68 y.o. male contacted today regarding refills of specialty medication(s) Omalizumab  (XOLAIR )   Patient requested Courier to Provider Office   Delivery date: 10/12/24   Verified address: [facility] [place] [address] [place], [place] [address]   Medication will be filled on: 10/11/24

## 2024-09-27 NOTE — Progress Notes (Signed)
 Specialty Pharmacy Ongoing Clinical Assessment Note  Devon Gordon is a 68 y.o. male who is being followed by the specialty pharmacy service for RxSp Asthma/COPD   Patient's specialty medication(s) reviewed today: Omalizumab  (XOLAIR )   Missed doses in the last 4 weeks: 0   Patient/Caregiver did not have any additional questions or concerns.   Therapeutic benefit summary: Patient is achieving benefit   Adverse events/side effects summary: No adverse events/side effects   Patient's therapy is appropriate to: Continue    Goals Addressed             This Visit's Progress    Minimize recurrence of flares   On track    Patient is on track. Patient will maintain adherence.          Follow up: 12 months  [facility]

## 2024-10-11 ENCOUNTER — Other Ambulatory Visit: Payer: Self-pay

## 2024-10-16 ENCOUNTER — Ambulatory Visit: Admitting: *Deleted

## 2024-10-16 DIAGNOSIS — J4540 Moderate persistent asthma, uncomplicated: Secondary | ICD-10-CM | POA: Diagnosis not present

## 2024-10-30 ENCOUNTER — Telehealth: Payer: Self-pay | Admitting: Allergy and Immunology

## 2024-10-30 MED ORDER — PREDNISONE 10 MG PO TABS: 10.0000 mg | ORAL_TABLET | Freq: Every day | ORAL | 0 refills | Status: AC

## 2024-10-30 MED ORDER — AMOXICILLIN-POT CLAVULANATE 875-125 MG PO TABS: 1.0000 | ORAL_TABLET | Freq: Two times a day (BID) | ORAL | 0 refills | Status: AC

## 2024-10-30 NOTE — Telephone Encounter (Signed)
 Patient states over the weekend he started having a lot of pressure in his sinuses and is now having green discharge. He is wanting to know if Dr. Maurilio can send in Prednisone  and an antibiotic to [facility].

## 2024-10-30 NOTE — Telephone Encounter (Signed)
 Prescriptions sent and patient was informed

## 2024-11-01 ENCOUNTER — Other Ambulatory Visit: Payer: Self-pay

## 2024-11-01 NOTE — Progress Notes (Signed)
 Specialty Pharmacy Refill Coordination Note  Devon Gordon is a 68 y.o. male assessed today regarding refills of clinic administered specialty medication(s) Omalizumab  (XOLAIR )   Clinic requested Courier to Provider Office   Delivery date: 11/09/24   Verified address: [facility] [place] [address] [place], [place] [address]   Medication will be filled on: 11/08/24   Copay: $0.00 Appointment: 12.15.25

## 2024-11-08 ENCOUNTER — Other Ambulatory Visit: Payer: Self-pay

## 2024-11-13 ENCOUNTER — Ambulatory Visit: Admitting: *Deleted

## 2024-11-13 DIAGNOSIS — J4540 Moderate persistent asthma, uncomplicated: Secondary | ICD-10-CM | POA: Diagnosis not present

## 2024-11-27 ENCOUNTER — Other Ambulatory Visit: Payer: Self-pay

## 2024-12-04 ENCOUNTER — Other Ambulatory Visit: Payer: Self-pay | Admitting: Allergy and Immunology

## 2024-12-04 ENCOUNTER — Other Ambulatory Visit: Payer: Self-pay

## 2024-12-04 MED ORDER — OMALIZUMAB 150 MG/ML ~~LOC~~ SOSY
300.0000 mg | PREFILLED_SYRINGE | SUBCUTANEOUS | 11 refills | Status: AC
  Filled 2024-12-05 – 2024-12-06 (×4): qty 2, 28d supply, fill #0
  Filled 2024-12-28 (×2): qty 2, 28d supply, fill #1

## 2024-12-05 ENCOUNTER — Other Ambulatory Visit: Payer: Self-pay

## 2024-12-06 ENCOUNTER — Telehealth: Payer: Self-pay

## 2024-12-06 ENCOUNTER — Other Ambulatory Visit: Payer: Self-pay

## 2024-12-06 NOTE — Telephone Encounter (Signed)
 Per Tammy- patient will not qualify for PAP, may sign up for Medicare Payment Plan or pay cost up-front. Pending call to patient to clarify options.

## 2024-12-06 NOTE — Progress Notes (Signed)
 Specialty Pharmacy Refill Coordination Note  Devon Gordon is a 69 y.o. male contacted today regarding refills of specialty medication(s) Omalizumab  (XOLAIR )   Patient requested Courier to Provider Office   Delivery date: 12/07/24   Verified address: [facility] [place] [address] , [place] [address]   Medication will be filled on: 12/06/24   Patient aware that copay will be high for first 2-3 months of the year. Has [employer] to cover this.

## 2024-12-06 NOTE — Telephone Encounter (Signed)
 Messaged Tammy V for approval letter from insurance

## 2024-12-06 NOTE — Telephone Encounter (Signed)
 Copay of $1068.92- look into copay card/PAP/or patient payment plan.

## 2024-12-07 ENCOUNTER — Other Ambulatory Visit: Payer: Self-pay

## 2024-12-08 ENCOUNTER — Other Ambulatory Visit: Payer: Self-pay

## 2024-12-08 ENCOUNTER — Other Ambulatory Visit (HOSPITAL_COMMUNITY): Payer: Self-pay

## 2024-12-08 NOTE — Telephone Encounter (Signed)
 Delivery date: 12/07/24   Verified address: [facility] [place] [address] [place], [place] [address]     Medication will be filled on: 12/06/24     Patient aware that copay will be high for first 2-3 months of the year. Has [employer] to cover this.

## 2024-12-11 ENCOUNTER — Ambulatory Visit: Admitting: *Deleted

## 2024-12-11 DIAGNOSIS — J4540 Moderate persistent asthma, uncomplicated: Secondary | ICD-10-CM

## 2024-12-28 ENCOUNTER — Other Ambulatory Visit: Payer: Self-pay

## 2024-12-28 NOTE — Progress Notes (Signed)
 Specialty Pharmacy Refill Coordination Note  Devon Gordon is a 69 y.o. male assessed today regarding refills of clinic administered specialty medication(s) Omalizumab  (XOLAIR )   Clinic requested Courier to Provider Office   Delivery date: 01/04/25   Verified address: [facility] [place] [address] [place], [place] [address]   Medication will be filled on: 01/03/25  Appointment 01/08/25.

## 2025-01-03 ENCOUNTER — Other Ambulatory Visit: Payer: Self-pay

## 2025-01-08 ENCOUNTER — Ambulatory Visit
